# Patient Record
Sex: Female | Born: 1940
Health system: Southern US, Community
[De-identification: ages and names within clinical notes are randomized; demographics above are authoritative.]

## PROBLEM LIST (undated history)

## (undated) DIAGNOSIS — I4891 Unspecified atrial fibrillation: Secondary | ICD-10-CM

## (undated) DIAGNOSIS — I350 Nonrheumatic aortic (valve) stenosis: Secondary | ICD-10-CM

## (undated) DIAGNOSIS — I509 Heart failure, unspecified: Secondary | ICD-10-CM

## (undated) DIAGNOSIS — I10 Essential (primary) hypertension: Secondary | ICD-10-CM

## (undated) HISTORY — DX: Unspecified atrial fibrillation: I48.91

## (undated) HISTORY — DX: Heart failure, unspecified: I50.9

## (undated) HISTORY — PX: DILATION AND CURETTAGE OF UTERUS: SHX78

## (undated) HISTORY — DX: Essential (primary) hypertension: I10

## (undated) HISTORY — DX: Nonrheumatic aortic (valve) stenosis: I35.0

## (undated) HISTORY — PX: TOTAL KNEE ARTHROPLASTY: SHX125

## (undated) HISTORY — PX: JOINT REPLACEMENT: SHX530

## (undated) HISTORY — PX: BILATERAL CARPAL TUNNEL RELEASE: SHX6508

---

## 2001-06-11 ENCOUNTER — Other Ambulatory Visit: Admission: RE | Admit: 2001-06-11 | Discharge: 2001-06-11 | Payer: Self-pay | Admitting: *Deleted

## 2001-08-15 ENCOUNTER — Encounter: Payer: Self-pay | Admitting: Internal Medicine

## 2001-08-15 ENCOUNTER — Emergency Department (HOSPITAL_COMMUNITY): Admission: EM | Admit: 2001-08-15 | Discharge: 2001-08-15 | Payer: Self-pay | Admitting: Emergency Medicine

## 2001-12-12 ENCOUNTER — Ambulatory Visit (HOSPITAL_COMMUNITY): Admission: RE | Admit: 2001-12-12 | Discharge: 2001-12-12 | Payer: Self-pay | Admitting: Gastroenterology

## 2001-12-12 ENCOUNTER — Encounter (INDEPENDENT_AMBULATORY_CARE_PROVIDER_SITE_OTHER): Payer: Self-pay | Admitting: Specialist

## 2003-12-31 ENCOUNTER — Encounter: Admission: RE | Admit: 2003-12-31 | Discharge: 2003-12-31 | Payer: Self-pay | Admitting: Internal Medicine

## 2005-10-25 ENCOUNTER — Encounter: Admission: RE | Admit: 2005-10-25 | Discharge: 2005-10-25 | Payer: Self-pay

## 2008-01-11 ENCOUNTER — Encounter: Admission: RE | Admit: 2008-01-11 | Discharge: 2008-01-11 | Payer: Self-pay | Admitting: Internal Medicine

## 2008-02-04 ENCOUNTER — Inpatient Hospital Stay (HOSPITAL_COMMUNITY): Admission: RE | Admit: 2008-02-04 | Discharge: 2008-02-07 | Payer: Self-pay | Admitting: Orthopedic Surgery

## 2008-11-03 ENCOUNTER — Encounter: Admission: RE | Admit: 2008-11-03 | Discharge: 2008-11-03 | Payer: Self-pay | Admitting: Internal Medicine

## 2009-01-26 ENCOUNTER — Inpatient Hospital Stay (HOSPITAL_COMMUNITY): Admission: RE | Admit: 2009-01-26 | Discharge: 2009-01-29 | Payer: Self-pay | Admitting: Orthopedic Surgery

## 2009-09-02 ENCOUNTER — Encounter: Admission: RE | Admit: 2009-09-02 | Discharge: 2009-09-10 | Payer: Self-pay | Admitting: Orthopedic Surgery

## 2009-12-02 ENCOUNTER — Encounter: Admission: RE | Admit: 2009-12-02 | Discharge: 2010-03-02 | Payer: Self-pay | Admitting: Orthopedic Surgery

## 2011-02-02 ENCOUNTER — Ambulatory Visit (HOSPITAL_BASED_OUTPATIENT_CLINIC_OR_DEPARTMENT_OTHER)
Admission: RE | Admit: 2011-02-02 | Discharge: 2011-02-02 | Disposition: A | Discharge status: Home / Self Care | Payer: Medicare Other | Source: Ambulatory Visit | Attending: Orthopedic Surgery | Admitting: Orthopedic Surgery

## 2011-02-02 DIAGNOSIS — Z01810 Encounter for preprocedural cardiovascular examination: Secondary | ICD-10-CM | POA: Insufficient documentation

## 2011-02-02 DIAGNOSIS — M6789 Other specified disorders of synovium and tendon, multiple sites: Secondary | ICD-10-CM | POA: Insufficient documentation

## 2011-02-02 DIAGNOSIS — Z01812 Encounter for preprocedural laboratory examination: Secondary | ICD-10-CM | POA: Insufficient documentation

## 2011-02-02 DIAGNOSIS — M2390 Unspecified internal derangement of unspecified knee: Secondary | ICD-10-CM | POA: Insufficient documentation

## 2011-02-02 DIAGNOSIS — M898X9 Other specified disorders of bone, unspecified site: Secondary | ICD-10-CM | POA: Insufficient documentation

## 2011-02-02 DIAGNOSIS — Z96659 Presence of unspecified artificial knee joint: Secondary | ICD-10-CM | POA: Insufficient documentation

## 2011-02-08 NOTE — Op Note (Signed)
[name], Rodriguez              ACCOUNT NO.:  [id number]  MEDICAL RECORD NO.:  [id number]           PATIENT TYPE:  LOCATION:                                 FACILITY:  PHYSICIAN:  [name], M.D.         DATE OF BIRTH:  DATE OF PROCEDURE:  02/02/2011 DATE OF DISCHARGE:                              OPERATIVE REPORT   PREOPERATIVE DIAGNOSIS:  Right knee hypertrophic synovium and snapping of popliteal tendon.  POSTOPERATIVE DIAGNOSIS:  Right knee hypertrophic synovium and snapping of popliteal tendon.  PROCEDURE:  Right knee arthrotomy, localized synovectomy and tenolysis.  SURGEON:  [name], M.D.  ASSISTANT.:  None.  ANESTHESIA:  General.  ESTIMATED BLOOD LOSS:  Minimal.  DRAINS:  None.  COMPLICATIONS:  None.  CONDITION.:  Stable to Recovery.  BRIEF CLINICAL NOTE:  [name] is a 70 year old female, had a total knee arthroplasty done over a year ago and was doing extremely well but has a lateralized soreness and lateralized popping.  This makes the knee sometimes feel like it wants to give out on her due to the popping. Ligaments were stable.  She had excellent range of motion.  It was felt that the popliteal tendon is snapping over the lateral border of the prosthesis.  She presents now for possible tendon release and excision of any of localized hypertrophic synovitis.  PROCEDURE IN DETAIL:  After successful administration of general anesthetic, a tourniquet was placed high on her right thigh and right lower extremity was prepped and draped in the usual sterile fashion.  I placed the knee through the range of motion and felt the exact area where the popping was occurring.  It was over the lateral joint line slightly posteriorly.  A vertical incision was made over this area, total incision length about 3 inches.  I first elevated the tourniquet to 300.  I made the incision with a #10 blade through the subcutaneous tissue.  It was obvious that we had a  venous tourniquet, so let the tourniquet down and the bleeding was minimal.  I stopped the bleeding with electrocautery.  I incised down to the lateral retinaculum and made a small incision in the retinaculum.  We then entered the joint.  I felt there was popping right at the area of the popliteus tendon.  There was a spur that the tendon was tented over.  I removed that spur with the rongeur.  Also there was a fair amount of localized hypertrophic synovium which was excised.  After I did this, I placed the knee through range of motion and there was still a tiny bit of popping to free edge of the tendon, so I incised that edge of the tendon and did not fully release it.  This effectively eliminated all the popping when I put the knee through range of motion.  I then placed a clamp over the retinaculum and placed through range of motion again to see if it would snap with the retinaculum closed and it did not snap any more.  Wound was then copiously irrigated with saline solution and the capsule closed with interrupted #  1 Vicryl at retinaculum, interrupted #1 Vicryl at subcu, interrupted 2-0 Vicryl and subcuticular running 4-0 Monocryl.  I then injected about 20 cc of 0.25% Marcaine with epi into the subcutaneous tissues.  Incisions were then cleaned and dried.  I placed the knee through range of motion again.  There was no crepitus, no lateral popping.  Steri-Strips and a bulky sterile dressing were then applied and she was then awakened and transported to Recovery in stable condition.     [name], M.D.     FA/MEDQ  D:  02/02/2011  T:  02/02/2011  Job:  [id number]  Electronically Signed by [name] M.D. on 02/08/2011 07:14:52 AM

## 2011-02-17 LAB — BASIC METABOLIC PANEL
BUN: 4 mg/dL — ABNORMAL LOW (ref 6–23)
BUN: 6 mg/dL (ref 6–23)
BUN: 8 mg/dL (ref 6–23)
CO2: 27 mEq/L (ref 19–32)
CO2: 29 mEq/L (ref 19–32)
Calcium: 8.8 mg/dL (ref 8.4–10.5)
Chloride: 101 mEq/L (ref 96–112)
Chloride: 106 mEq/L (ref 96–112)
Creatinine, Ser: 0.71 mg/dL (ref 0.4–1.2)
Creatinine, Ser: 0.74 mg/dL (ref 0.4–1.2)
GFR calc Af Amer: >60 mL/min (ref >60)
GFR calc non Af Amer: >60 mL/min (ref >60)
GFR calc non Af Amer: >60 mL/min (ref >60)
Glucose, Bld: 111 mg/dL — ABNORMAL HIGH (ref 70–99)
Glucose, Bld: 133 mg/dL — ABNORMAL HIGH (ref 70–99)
Glucose, Bld: 135 mg/dL — ABNORMAL HIGH (ref 70–99)
Potassium: 4 mEq/L (ref 3.5–5.1)
Potassium: 4.6 mEq/L (ref 3.5–5.1)
Sodium: 137 mEq/L (ref 135–145)

## 2011-02-17 LAB — CBC
HCT: 31.2 % — ABNORMAL LOW (ref 36.0–46.0)
HCT: 34.9 % — ABNORMAL LOW (ref 36.0–46.0)
HCT: 41 % (ref 36.0–46.0)
Hemoglobin: 11.7 g/dL — ABNORMAL LOW (ref 12.0–15.0)
Hemoglobin: 13.9 g/dL (ref 12.0–15.0)
MCHC: 33.4 g/dL (ref 30.0–36.0)
MCHC: 33.5 g/dL (ref 30.0–36.0)
MCV: 85 fL (ref 78.0–100.0)
MCV: 86.3 fL (ref 78.0–100.0)
MCV: 86.6 fL (ref 78.0–100.0)
MCV: 87.4 fL (ref 78.0–100.0)
Platelets: 161 10*3/uL (ref 150–400)
Platelets: 164 10*3/uL (ref 150–400)
Platelets: 186 10*3/uL (ref 150–400)
RBC: 3.81 MIL/uL — ABNORMAL LOW (ref 3.87–5.11)
RDW: 13.9 % (ref 11.5–15.5)
RDW: 14 % (ref 11.5–15.5)
RDW: 14 % (ref 11.5–15.5)
RDW: 14.3 % (ref 11.5–15.5)

## 2011-02-17 LAB — URINALYSIS, ROUTINE W REFLEX MICROSCOPIC
Bilirubin Urine: NEGATIVE
Hgb urine dipstick: NEGATIVE
Nitrite: NEGATIVE
Protein, ur: NEGATIVE mg/dL
Specific Gravity, Urine: 1.014 (ref 1.005–1.030)
Urobilinogen, UA: 0.2 mg/dL (ref 0.0–1.0)

## 2011-02-17 LAB — PROTIME-INR
INR: 1 (ref 0.00–1.49)
INR: 1.6 — ABNORMAL HIGH (ref 0.00–1.49)
Prothrombin Time: 12.9 seconds (ref 11.6–15.2)
Prothrombin Time: 17.5 seconds — ABNORMAL HIGH (ref 11.6–15.2)
Prothrombin Time: 20.1 seconds — ABNORMAL HIGH (ref 11.6–15.2)

## 2011-02-17 LAB — COMPREHENSIVE METABOLIC PANEL
Albumin: 3.5 g/dL (ref 3.5–5.2)
BUN: 12 mg/dL (ref 6–23)
Chloride: 110 mEq/L (ref 96–112)
Creatinine, Ser: 0.71 mg/dL (ref 0.4–1.2)
Glucose, Bld: 113 mg/dL — ABNORMAL HIGH (ref 70–99)
Total Bilirubin: 0.8 mg/dL (ref 0.3–1.2)
Total Protein: 6.8 g/dL (ref 6.0–8.3)

## 2011-02-17 LAB — APTT: aPTT: 31 seconds (ref 24–37)

## 2011-02-17 LAB — TYPE AND SCREEN: Antibody Screen: NEGATIVE

## 2011-03-22 NOTE — Op Note (Signed)
NAMETALA, EBER              ACCOUNT NO.:  [id number]   MEDICAL RECORD NO.:  [id number]          PATIENT TYPE:  INP   LOCATION:  0002                         FACILITY:  [facility]   PHYSICIAN:  Ollen Gross, M.D.    DATE OF BIRTH:  04-Jul-1941   DATE OF PROCEDURE:  01/26/2009  DATE OF DISCHARGE:                               OPERATIVE REPORT   PREOPERATIVE DIAGNOSIS:  Osteoarthritis, right knee.   POSTOPERATIVE DIAGNOSIS:  Osteoarthritis, right knee.   PROCEDURE:  Right total knee arthroplasty.   SURGEON:  Ollen Gross, M.D.   ASSISTANT:  Alexzandrew L. Perkins, P.A.C.   ANESTHESIA:  Spinal.   ESTIMATED BLOOD LOSS:  Minimal.   DRAINS:  None.   COMPLICATIONS:  None.   TOURNIQUET TIME:  39 minutes at 300 mmHg.   CONDITION:  Stable to recovery.   CLINICAL NOTE:  [name] is a 70 year old female with end-stage  arthritis of the right knee with progressively worsening pain and  dysfunction.  She had a recent left total knee arthroplasty which has  done very well.  She presents now for right total knee arthroplasty.   PROCEDURE IN DETAIL:  After successful administration of spinal  anesthetic a tourniquet was placed high on her right thigh and her right  lower extremity was prepped and draped in the usual sterile fashion.  The extremity was wrapped in Esmarch.  The knee flexed.  The tourniquet  inflated to 300 mmHg.  A midline incision was made with a 10 blade  through subcutaneous tissue to the level of the extensor mechanism.  A  fresh blade was used to make a medial parapatellar arthrotomy.  The soft  tissue over the proximal medial tibia was subperiosteally elevated to  the joint line with the knife and into the semimembranosus bursa with a  Cobb elevator.  The soft tissue laterally was elevated with attention  being paid to avoid the patellar tendon on the tibial tubercle.  The  patella was everted and the knee flexed 90 degrees and ACL and PCL were  removed.  The  drill was used to create a starting hole in the distal  femur and the canal was thoroughly irrigated.  The 5 degrees right  valgus alignment guide was placed and referencing off the posterior  condyles rotation was marked and the block pinned to remove 11 mm of the  distal femur.  I took 11 because of preop flexion contracture.  The  distal femoral resection was made with an oscillating saw.  The sizing  block was placed and size 3 was most appropriate.  Rotation was marked  off the epicondylar axis.  The size 3 cutting block was placed and the  anterior, posterior and chamfer cuts were made.   The tibia was subluxed forward and the menisci were removed.  The  extramedullary tibial alignment guide was placed referencing proximally  at the medial aspect of the tibial tubercle and distally along the  second metatarsal axis and tibial crest.  The block was pinned to remove  about 4 mm off the more deficient medial side.  Tibial resection  was  made with an oscillating saw.  A size 3 was the most appropriate tibial  component and the proximal tibia was prepared with the modular drill and  keel punch for the size 3.  Femoral preparation was completed with the  intercondylar cut.   The size 3 mobile bearing tibial trial, size 3 posterior stabilized  femoral trial and a 10 mm posterior stabilized rotating platform insert  trial were placed.  With the 10 there was a little bit of AP laxity and  hyperextension, so I went to 12.5 which allowed for full extension with  excellent varus, valgus, anterior and posterior balance throughout full  range of motion.  The patella was everted and thickness measured to be  22 mm.  The freehand resection was taken to 12 mm, a 38 template was  placed, lug holes were drilled, trial patella was placed and it tracked  normally.  Osteophytes were removed off the posterior femur with the  trial in place.  All trials were removed and the cut bone surfaces were   prepared with pulsatile lavage.  The cement was mixed and once ready for  implantation the size 3 mobile bearing tibial tray, the size 3 posterior  stabilized femur and 38 patella were cemented into place.  The patella  was held with a clamp.  The trial 12.5 insert was placed, the knee held  in full extension and all extruded cement removed.  When the cement  fully hardened then the permanent 12.5 mm posterior stabilized rotating  platform insert was placed into the tibial tray.  The wound was  copiously irrigated with saline solution and the FloSeal injected on the  posterior capsule, the medial and lateral gutters and suprapatellar  area.  A moist sponge was placed and tourniquet released for total time  of 39 minutes.  The sponge was held for 2 minutes then removed.  Minimal  bleeding was encountered.  The bleeding that was encountered was stopped  with electrocautery.  The wound was again irrigated and the arthrotomy  closed with interrupted #1 PDS.  Flexion against gravity was 140  degrees.  The subcu was closed with interrupted 2-0 Vicryl and  subcuticular running 4-0 Monocryl.  The incision was cleaned and dried  and Steri-Strips and a bulky sterile dressing applied.  She was then  awakened and transported to recovery in stable condition.      Ollen Gross, M.D.  Electronically Signed     FA/MEDQ  D:  01/26/2009  T:  01/26/2009  Job:  [id number]

## 2011-03-22 NOTE — Discharge Summary (Signed)
[name], [name]              ACCOUNT NO.:  [id number]   MEDICAL RECORD NO.:  [id number]          PATIENT TYPE:  INP   LOCATION:  1606                         FACILITY:  [facility]   PHYSICIAN:  Ollen Gross, M.D.    DATE OF BIRTH:  [birth year]   DATE OF ADMISSION:  01/26/2009  DATE OF DISCHARGE:  01/29/2009                               DISCHARGE SUMMARY   ADMITTING DIAGNOSES:  1. Osteoarthritis right knee.  2. Hyperlipidemia.  3. Obesity.  4. Past history of urinary tract infections.  5. Urinary incontinence.  6. History of metabolic syndrome.  7. Postmenopausal.  8. Documented hypothyroidism (not on thyroid supplements).   DISCHARGE DIAGNOSES:  1. Osteoarthritis of the right knee status post right total knee      replacement arthroplasty.  2. Mild hyperkalemia (probably slight hemolysis).  3. Hyperlipidemia.  4. Obesity.  5. Past history of urinary tract infections.  6. Urinary incontinence.  7. History of metabolic syndrome.  8. Postmenopausal.  9. Documented hypothyroidism (not on thyroid supplements).   PROCEDURE:  January 26, 2009 right total knee.  Surgeon Dr. Lequita Halt,  assistant Avel Peace PA-C.  Spinal anesthesia.   CONSULTS:  None.   BRIEF HISTORY:  [name] is a 70 year old female with end-stage  arthritis of the right knee, progressive worsening pain and dysfunction,  recent left total knee now presents for right total knee.   LABORATORY DATA:  Preop CBC showed a hemoglobin of 13.9, hematocrit  41.0, white cell count 6.2, platelets 186.  Protime INR 12.9 and 1.0  with PTT of 31.  Chem panel on admission all within normal limits.  Preop UA negative.  Serial CBCs were followed.  Hemoglobin dropped down  to 11.7 and 11.1.  Last known  H and H 10.4 and 31.2.  Serial protimes  followed per Coumadin protocol.  Last known PT/INR 17.5 and 1.4.  Serial  BMETS were followed.  Potassium went up to 3.7, a high level of 5.2,  probable hemolysis, back down to a normal  level of 4.0.   EKG January 20, 2009:  Normal sinus rhythm, left anterior fascicular  block, minimal voltage criteria for LVH, no __________ performed per Dr.  Lady Deutscher.   HOSPITAL COURSE:  The patient admitted to [facility].  Taken  to the OR.  Underwent above-stated procedure without complication.  The  patient tolerated the procedure well.  Later transferred to recovery  room and orthopedic floor.  Started on PCA and p.o. analgesic pain  control following surgery.  Given 24 hours postop IV antibiotics.  I's  and O's were followed.  The patient doing pretty well on the morning of  day #1 except with some pain.  Encouraged p.o. meds for better control.  Started getting up out of bed.  Hemoglobin looked stable.  Walked only  about 4 or 5 feet on day #1.  By day #2 was walking over 120 feet.  Dressing changed on day #2.  Incision looked good.  Weaned over to p.o.  meds.  DC'd the PCA and IV fluids.  Potassium was a little high.  Felt  to be probably due to some hemolysis. Everything else looked good.  We  rechecked that, and potassium came down to a normal level of 4.  She  continued to progress well with therapy and ready to go home by postop  day #3 on January 29, 2009.   DISCHARGE PLAN:  1. The patient discharged home on January 29, 2009.  2. Discharge diagnoses, please see above.  3. Discharge medications, Coumadin, Robaxin, Percocet.   FOLLOWUP:  Two weeks.   ACTIVITY:  Weightbearing as tolerated.  Home health PT, home health  nursing.   DISPOSITION:  Home.   CONDITION UPON DISCHARGE:  Improved.      Alexzandrew L. Perkins, P.A.C.      Ollen Gross, M.D.  Electronically Signed    ALP/MEDQ  D:  01/29/2009  T:  01/29/2009  Job:  [id number]   cc:   Allena Napoleon  Fax: [phone]

## 2011-03-22 NOTE — Op Note (Signed)
[name], Rodriguez              ACCOUNT NO.:  [id number]   MEDICAL RECORD NO.:  [id number]          PATIENT TYPE:  INP   LOCATION:  0003                         FACILITY:  [facility]   PHYSICIAN:  Ollen Gross, M.D.    DATE OF BIRTH:  03/25/1941   DATE OF PROCEDURE:  02/04/2008  DATE OF DISCHARGE:                               OPERATIVE REPORT   PREOP DIAGNOSIS:  Osteoarthritis left knee.   POSTOP DIAGNOSIS:  Osteoarthritis left knee.   PROCEDURE:  Left total knee arthroplasty.   SURGEON:  Dr. Lequita Halt.   ASSISTANT:  Avel Peace, PA-C   ANESTHESIA:  Spinal with Duramorph.   ESTIMATED BLOOD LOSS:  Minimal.   DRAINS:  None.   TOURNIQUET TIME:  Thirty-seven minutes at 300 mmHg.   COMPLICATIONS:  None.   CONDITION:  Stable to recovery.   BRIEF CLINICAL NOTE:  [name] is a 70 year old female end-stage  arthritis both knees, left more symptomatic than the right.  She has  failed nonoperative management and presents for left total knee  arthroplasty and right knee cortisone injection.   PROCEDURE IN DETAIL:  After the successful administration of a spinal  anesthetic, tourniquet is placed high on the left thigh and left lower  extremity prepped and draped in the usual sterile fashion.  Extremities  were wrapped in an Esmarch, knee flexed, tourniquet inflated to 300  mmHg.  A midline incision made with a 10 blade through subcutaneous  tissue to the level of the extensor mechanism.  A fresh blade is used  make a medial parapatellar arthrotomy.  Soft tissue over the proximal  medial tibia subperiosteally elevated to the joint line with a knife  into the semimembranosus bursa with a Cobb elevator.  Soft tissue  laterally is elevated with attention being paid to avoid patellar tendon  on tibial tubercle.  Patella subluxed laterally, the knee flexed 90  degrees and the PCL was removed.  The ACL was already gone.  A drill was  used to create a starting hole in the distal femur  and the canal was  thoroughly irrigated.  The 5 degree left valgus alignment guide is  placed and referencing off the posterior condyles rotations are marked  and the block pinned to remove 11 mm of the distal femur.  I took 11  because of a preop flexion contracture.  The sizing blocks were placed.  The size 2.5 is most appropriate.  Rotations marked at the epicondylar  axis.  A size 2.5 cutting block is placed and the anterior, posterior  and chamfer cuts made.   The tibia subluxed forward and the menisci removed.  Extramedullary  tibial alignment guide is placed referencing proximally at the medial  aspect of the tibial tubercle and distally along the second metatarsal  axis and tibial crest.  The blocks pinned to remove about 10 mm off the  non deficient lateral side.  Tibial resection is made with an  oscillating saw.  It was a large medial osteophyte which is removed.  Size three is the most appropriate tibial component and the proximal  tibia prepared the  modular drill and keel punch for a size three.  Femoral preparation is completed with intercondylar cut for a size 2.5.   Size 3 mobile bearing tibial trial and size 2.5 posterior stabilized  femoral trial and 12.5 mm posterior stabilized rotating platform insert  trial placed.  Full extension is achieved with a tiny bit of varus-  valgus, anterior and posterior play.  I went to a 15 mm insertion and  had full extension with excellent varus and valgus anterior and  posterior balance throughout full range of motion.  Patella was everted  and thickness measured to be 22 mm.  Freehand resection was taken to 13  mm, a 38 template is placed, lug holes were drilled, trial patella was  placed and it tracks normally.  Osteophytes were removed off the  posterior femur with the trial in place.  All trials were removed and  the cut bone surfaces prepared with pulsatile lavage.  Cement is mixed  and once ready for implantation the size 3  mobile bearing tibial tray,  size 2.5 posterior stabilized femur and 38 patella are cemented in  place.  Patella is held with a clamp.  The trial 15 insert is placed,  the knee held in full extension and all extruded cement removed.  Once  the cement was fully hardened, the permanent 15 mm posterior stabilized  rotating platform insert is placed in the tibial tray.  Wounds are  copiously irrigated with saline solution and FloSeal then injected  posteriorly on the medial and lateral gutters and suprapatellar area.  Moist sponge is placed and tourniquet released for a total time of 37  minutes.  Sponge is held for 2 minutes and then removed.  Minimal  bleeding was encountered.  The bleeding that is encountered was stopped  with electrocautery.  I then thoroughly irrigated the wound again with  saline and closed the extensor mechanism with interrupted #1 PDS.  Flexion against gravity was about 130 degrees at which point the calf  and posterior thigh are meeting.  The subcu is then closed with  interrupted 2-0 Vicryl, the subcuticular with running 4-0 Monocryl.  Incisions cleaned and dried and Steri-Strips and a bulky sterile  dressing applied.   I then thoroughly prepped the right knee and injected it with lidocaine  and Depo-Medrol with no problems.  The patient is subsequently awakened  and transported to recovery in stable condition.      Ollen Gross, M.D.  Electronically Signed     FA/MEDQ  D:  02/04/2008  T:  02/04/2008  Job:  [id number]

## 2011-03-22 NOTE — H&P (Signed)
[name], [name]              ACCOUNT NO.:  [id number]   MEDICAL RECORD NO.:  [id number]          PATIENT TYPE:  INP   LOCATION:  NA                           FACILITY:  [facility]   PHYSICIAN:  Ollen Gross, M.D.    DATE OF BIRTH:  [birth year]   DATE OF ADMISSION:  01/26/2009  DATE OF DISCHARGE:                              HISTORY & PHYSICAL   DATE OF OFFICE VISIT HISTORY AND PHYSICAL:  January 06, 2009.   CHIEF COMPLAINT:  Right knee pain.   HISTORY OF PRESENT ILLNESS:  The patient is a 70 year old female who has  been seen by Dr. Lequita Halt for ongoing right knee pain.  She has  previously undergone a left total knee back in March 2009.  The right  knee, unfortunately, continues to become a problem.  She has known  arthritis, felt to be a candidate.  The risks and benefits have been  discussed, she would like to proceed with surgery.  At the time of this  dictation we were awaiting her clearance from Dr. Alessandra Bevels.   ALLERGIES:  No known drug allergies.   CURRENT MEDICATIONS:  Advil, Vitamin E, Fish Oil, CoQ 10, magnesium,  calcium and potassium.   PAST MEDICAL HISTORY:  1. Hyperlipidemia.  2. Obesity.  3. Past history of urinary tract infections.  4. Urinary incontinence.  5. History of metabolic syndrome.  6. Postmenopausal.  7. Documented history of hypothyroidism, however is not on any thyroid      supplements.   PAST SURGICAL HISTORY:  1. Multiple D and Cs.  2. Removal of cyst from neck age 75.  3. Bilateral knee scopes and also the left total knee.   SOCIAL HISTORY:  Married, retired, nonsmoker, very seldom intake of  alcohol.  Three children.  A sister, who is a retired Engineer, civil (consulting), will be  assisting with care after surgery.  A 1-story home with 4 steps  entering.  She does have a Living Will and Healthcare Power-Of-Attorney.   REVIEW OF SYSTEMS:  GENERAL:  No fevers, chills, night sweats.  NEURO:  No seizures, syncope or paralysis.  RESPIRATORY:  No shortness of  breath, productive cough or hemoptysis.  CARDIOVASCULAR:  No chest pain,  angina, orthopnea.  GI: No nausea, vomiting, diarrhea or constipation.  GU:  No dysuria or hematuria.  A little bit of nocturia, a little bit of  incontinence.  MUSCULOSKELETAL:  Knee pain.   VITAL SIGNS:  Pulse 74, respirations 12, blood pressure 142/72.  GENERAL:  This is a 70 year old white female, well-nourished, well-  developed, overweight, in no acute distress.  She is alert, oriented,  cooperative, pleasant, a good historian.  HEENT:  Normocephalic, atraumatic.  Pupils round, reactive.  EOMs  intact.  Does wear glasses.  NECK:  Supple.  No bruits.  CHEST:  Clear anterior to posterior chest walls.  No rhonchi, rales or  wheezing.  HEART:  Regular rate and rhythm, no murmur, S1-S10 noted.  ABDOMEN:  Soft, round, protuberant abdomen.  Bowel sounds present.  RECTAL/BREASTS/GENITALIA:  Not done, not pertinent to present illness.  EXTREMITIES:  Right knee range of  motion 5-120, marked crepitus, tender  more medial than lateral.   IMPRESSION:  Osteoarthritis, right knee.   PLAN:  The patient admitted to [facility] to undergo a right  total knee replacement arthroplasty.  Surgery will be performed by Dr.  Ollen Gross.      Alexzandrew L. Perkins, P.A.C.      Ollen Gross, M.D.  Electronically Signed    ALP/MEDQ  D:  01/25/2009  T:  01/25/2009  Job:  [id number]   cc:   Allena Napoleon  Fax: [phone]   Ollen Gross, M.D.  Fax: [phone]

## 2011-03-22 NOTE — H&P (Signed)
[name], [name]              ACCOUNT NO.:  [id number]   MEDICAL RECORD NO.:  [id number]          PATIENT TYPE:  INP   LOCATION:  1607                         FACILITY:  [facility]   PHYSICIAN:  Ollen Gross, M.D.    DATE OF BIRTH:  01/13/1941   DATE OF ADMISSION:  02/04/2008  DATE OF DISCHARGE:  02/07/2008                              HISTORY & PHYSICAL   CHIEF COMPLAINT:  Left knee pain.   HISTORY OF PRESENT ILLNESS:  The patient is a 70 year old female who has  seen by Dr. Lequita Halt for ongoing pain bilateral knees for quite some time  now.  She was referred over by Dr. Allena Napoleon for evaluation.  She  was found in the office to have end-stage arthritis in both knees.  The  left is more symptomatic than the right at this point, progressively  getting worse and felt to benefit from undergoing surgical intervention.  The risks and benefits have been discussed and she elected to proceed  with surgery.   ALLERGIES:  No known drug allergies.   CURRENT MEDICATIONS:  1. Ibuprofen.  2. Potassium.   PAST MEDICAL HISTORY:  History of UTIs, hypothyroidism, hyperlipidemia,  metabolic syndrome, postmenopausal.   PAST SURGICAL HISTORY:  Multiple D and Cs, removal of cyst from her neck  at age 34, bilateral knee scopes.   SOCIAL HISTORY:  Married, works as a Investment banker, operational, a Product manager.  Nonsmoker.  No alcohol.  Three children.   FAMILY HISTORY:  Prostate cancer.   REVIEW OF SYSTEMS:  GENERAL:  No fevers, chills or night sweats.  No  seizures, syncope or paralysis.  RESPIRATORY:  No shortness breath,  productive cough or hemoptysis.  CARDIOVASCULAR:  No chest pain, angina  or orthopnea.  GI:  No nausea, vomiting, diarrhea or constipation.  GU:  No dysuria or hematuria or discharge.  Past history of UTIs.  MUSCULOSKELETAL:  Bilateral knees.   PHYSICAL EXAMINATION:  VITAL SIGNS:  Pulse 68, respirations 12, blood  pressure 136/72.  GENERAL:  A 70 year old white female,  well-nourished, well-developed,  overweight with hip and thigh obesity.  Alert and oriented and  cooperative.  Excellent historian.  HEENT:  Normocephalic, atraumatic.  Pupils are round and reactive.  Oropharynx clear.  EOMs intact.  NECK:  Supple.  CHEST:  Clear anterior/posterior chest walls.  No rhonchi, rales or  wheezing.  HEART:  Regular rate and rhythm.  No murmur.  S1-S2 noted.  ABDOMEN:  Soft, round.  Bowel sounds present.  RECTAL:  Not done.  Not pertinent to present illness.  BREASTS:  Not done.  Not pertinent to present illness.  GENITALIA:  Not done.  Not pertinent to present illness.  EXTREMITIES:  Left knee range of motion 0-115, marked crepitus, tender  more medial than lateral.  Right knee shows range of motion 5-120,  marked crepitus, tender more medial than lateral.   IMPRESSION:  1. Osteoarthritis left knee greater than right.  2. Hypothyroidism.  3. Hyperlipidemia.  4. Obesity.  5. History of urinary tract infections.  6. Metabolic syndrome.  7. Postmenopausal.   PLAN:  The  patient admitted to [facility] and undergo a left  total knee replacement arthroplasty.  Surgery will be performed by Ollen Gross, M.D.      Alexzandrew L. Perkins, P.A.C.      Ollen Gross, M.D.  Electronically Signed    ALP/MEDQ  D:  02/07/2008  T:  02/08/2008  Job:  [id number]

## 2011-03-25 NOTE — Discharge Summary (Signed)
NAMEBIRTIE, [name]              ACCOUNT NO.:  [id number]   MEDICAL RECORD NO.:  [id number]          PATIENT TYPE:  INP   LOCATION:  1607                         FACILITY:  [facility]   PHYSICIAN:  Ollen Gross, M.D.    DATE OF BIRTH:  April 11, 1941   DATE OF ADMISSION:  02/04/2008  DATE OF DISCHARGE:  02/07/2008                               DISCHARGE SUMMARY   ADMITTING DIAGNOSIS:  1. Osteoarthritis, left knee greater than right.  2. Hypothyroidism.  3. Hyperlipidemia.  4. Obesity.  5. History of urinary tract infection.  6. Metabolic syndrome.  7. Postmenopausal.   DISCHARGE DIAGNOSIS:  1. Osteoarthritis left knee, status post left total knee arthroplasty.  2. Mild postoperative blood loss anemia, did not require transfusion.  3. Hypothyroidism.  4. Hyperlipidemia.  5. Obesity.  6. History of urinary tract infection.  7. Metabolic syndrome.  8. Postmenopausal.   PROCEDURE:  February 04, 2008, left total knee surgery by Dr. Lequita Halt,  assistant Avel Peace, PA-C, spinal anesthesia with Duramorph,  tourniquet time 37 minutes.   CONSULTS:  None.   BRIEF HISTORY:  [name] is a 70 year old female with end-stage  arthritis of both knees, left more symptomatic than right, failed  nonoperative management who now presents for a total knee arthroplasty.   LABORATORY DATA:  Rehab CBC shows a hemoglobin of 13.3, hematocrit of  38.8, white cell count 6.3, red cell count 4.56.  Postop hemoglobin 12.2  then 10.9.  Last H&H 11.6 and 32.8.  PT, PTT preop 12.9 and 30  respectively, INR 1.  Serial prothrombin time __________ PT, INR 16.7  and 1.3.  Chem panel on admission a low albumin of 3.3, remaining Chem  panel within normal limits.  Serial BMETs were followed, electrolytes  remained within normal limits, preop UA negative, blood type A positive.  EKG dated January 29, 2008, sinus rhythm with premature atrial complexes,  left axis deviation, normal tracing confirmed, no other tracing to  compare, confirmed by Dr. Donia Guiles.   HOSPITAL COURSE:  Patient admitted to [facility], tolerated  the procedure well.  __________ started on PCA and p.o. analgesics.  He  had a pretty rough night on the evening of surgery, doing a little bit  better on the morning of day 1, encouraged p.o. meds, started on  Coumadin for DVT prophylaxis.  It was also noted that she had arthritis  in the other knee on the right side.  She also received a cortisone  injection at the time of surgery.  Hemoglobin was stable postop with  excellent urinary output.  Did very well on day 1 and actually got up  and walked about 75 feet.  By day 2, she had some nausea and vomiting  early in the morning but that did resolve.  Dressing was changed on day  2, a little bit of bruising proximally, otherwise the incision looked  very good.  We discontinued her PCA and fluids once the nausea was  resolved.  Hemoglobin was 10.  Continued to progress well with physical  therapy and was ready to go home by February 07, 2008.   DISCHARGE PLAN:  1. The patient discharged home on February 07, 2008.  2. Discharge diagnosis please see above.  3. Discharge meds Coumadin, Percocet, Robaxin, Lovenox.   DIET:  Resume home diet.   ACTIVITY:  1. Weightbearing as tolerated.  2. Total knee protocol.  3. Home health PT.  4. Home health nursing.  5. Followup in 2 weeks.   DISPOSITION:  Home.   CONDITION UPON DISCHARGE:  Improved.      [name], P.A.C.      Ollen Gross, M.D.  Electronically Signed    ALP/MEDQ  D:  03/20/2008  T:  03/20/2008  Job:  [id number]   cc:   Ollen Gross, M.D.  Fax: [phone]

## 2011-03-25 NOTE — Procedures (Signed)
[facility]  Patient:    [name], [name] Visit Number: [id number] MRN: [id number]          Service Type: END Location: ENDO Attending Physician:  Louie Bun Dictated by:   Everardo All Madilyn Fireman, M.D. Proc. Date: 12/12/01 Admit Date:  12/12/2001   CC:         Brandt Loosen, M.D.                           Procedure Report  PROCEDURE:  Colonoscopy with polypectomy.  INDICATION FOR PROCEDURE:  Screening colonoscopy.  DESCRIPTION OF PROCEDURE:  The patient was placed in the left lateral decubitus position and placed on the pulse monitor with continuous low-flow oxygen delivered by nasal cannula.  She was sedated with 100 mg of IV Demerol and 10 mg of IV Versed.  The Olympus video colonoscope was inserted into the rectum and advanced to the cecum, confirmed by transillumination at McBurneys point and visualization of the ileocecal valve and appendiceal orifice.  The prep was excellent.  The cecum appeared normal.  Within the ascending colon there was a 5 mm polyp fulgurated by hot biopsy.  The remainder of the ascending colon appeared normal.  Within the transverse colon there was a 1.2 cm polyp removed by snare.  The remainder of the transverse colon appeared normal.  Within the descending and sigmoid colon there were seen several diverticula but no polyps.  In the rectum, at approximately 10 cm from the anal verge, there was a 1.5 cm pedunculated polyp removed by snare.  The colonoscope was then withdrawn, and the patient returned to the recovery room in stable condition.  She tolerated the procedure well, and there were no immediate complications.  IMPRESSION:  Ascending, transverse, and rectal polyps.  PLAN:  Await biopsy results to rule out malignancy and for determination of interval for next colonoscopy. Dictated by:   Everardo All Madilyn Fireman, M.D. Attending Physician:  Louie Bun DD:  12/12/01 TD:  12/13/01 Job: [id number]

## 2011-08-01 LAB — COMPREHENSIVE METABOLIC PANEL
AST: 24
Albumin: 3.3 — ABNORMAL LOW
Calcium: 10.3
Creatinine, Ser: 0.74
GFR calc Af Amer: >60
Sodium: 141
Total Protein: 6.8

## 2011-08-01 LAB — CBC
HCT: 35.2 — ABNORMAL LOW
Hemoglobin: 12.2
MCHC: 34.3
MCHC: 34.7
MCV: 85.1
MCV: 86
Platelets: 197
Platelets: 230
RDW: 13.4

## 2011-08-01 LAB — APTT: aPTT: 30

## 2011-08-01 LAB — URINALYSIS, ROUTINE W REFLEX MICROSCOPIC
Ketones, ur: NEGATIVE
Nitrite: NEGATIVE
Specific Gravity, Urine: 1.012
pH: 6

## 2011-08-01 LAB — TYPE AND SCREEN

## 2011-08-01 LAB — BASIC METABOLIC PANEL
BUN: 6
CO2: 27
GFR calc non Af Amer: >60
Glucose, Bld: 137 — ABNORMAL HIGH
Potassium: 4.4
Sodium: 141

## 2011-08-02 LAB — BASIC METABOLIC PANEL
BUN: 6
CO2: 30
Calcium: 8.8
Chloride: 104
Creatinine, Ser: 0.71
GFR calc Af Amer: >60
Glucose, Bld: 117 — ABNORMAL HIGH

## 2011-08-02 LAB — CBC
Hemoglobin: 11.6 — ABNORMAL LOW
MCHC: 35.1
MCHC: 35.5
MCV: 85.7
Platelets: 164
Platelets: 202
RBC: 3.62 — ABNORMAL LOW
RDW: 13.5
RDW: 13.8

## 2011-08-02 LAB — PROTIME-INR
INR: 1.1
INR: 1.3
Prothrombin Time: 14.8

## 2015-12-03 DIAGNOSIS — I700 Atherosclerosis of aorta: Secondary | ICD-10-CM | POA: Diagnosis not present

## 2015-12-03 DIAGNOSIS — Z0000 Encounter for general adult medical examination without abnormal findings: Secondary | ICD-10-CM | POA: Diagnosis not present

## 2015-12-03 DIAGNOSIS — R7303 Prediabetes: Secondary | ICD-10-CM | POA: Diagnosis not present

## 2015-12-03 DIAGNOSIS — K760 Fatty (change of) liver, not elsewhere classified: Secondary | ICD-10-CM | POA: Diagnosis not present

## 2015-12-03 DIAGNOSIS — R7309 Other abnormal glucose: Secondary | ICD-10-CM | POA: Diagnosis not present

## 2015-12-03 DIAGNOSIS — E559 Vitamin D deficiency, unspecified: Secondary | ICD-10-CM | POA: Diagnosis not present

## 2015-12-03 DIAGNOSIS — Z6841 Body Mass Index (BMI) 40.0 and over, adult: Secondary | ICD-10-CM | POA: Diagnosis not present

## 2016-01-13 DIAGNOSIS — D225 Melanocytic nevi of trunk: Secondary | ICD-10-CM | POA: Diagnosis not present

## 2016-01-13 DIAGNOSIS — Z1283 Encounter for screening for malignant neoplasm of skin: Secondary | ICD-10-CM | POA: Diagnosis not present

## 2016-01-13 DIAGNOSIS — L820 Inflamed seborrheic keratosis: Secondary | ICD-10-CM | POA: Diagnosis not present

## 2016-01-13 DIAGNOSIS — L918 Other hypertrophic disorders of the skin: Secondary | ICD-10-CM | POA: Diagnosis not present

## 2016-05-25 DIAGNOSIS — N950 Postmenopausal bleeding: Secondary | ICD-10-CM | POA: Diagnosis not present

## 2016-05-25 DIAGNOSIS — N760 Acute vaginitis: Secondary | ICD-10-CM | POA: Diagnosis not present

## 2016-05-25 DIAGNOSIS — B9689 Other specified bacterial agents as the cause of diseases classified elsewhere: Secondary | ICD-10-CM | POA: Diagnosis not present

## 2016-06-22 DIAGNOSIS — Z6841 Body Mass Index (BMI) 40.0 and over, adult: Secondary | ICD-10-CM

## 2016-06-22 DIAGNOSIS — N950 Postmenopausal bleeding: Secondary | ICD-10-CM | POA: Diagnosis not present

## 2016-06-22 DIAGNOSIS — N840 Polyp of corpus uteri: Secondary | ICD-10-CM | POA: Diagnosis not present

## 2016-07-19 DIAGNOSIS — L298 Other pruritus: Secondary | ICD-10-CM | POA: Diagnosis not present

## 2016-07-19 DIAGNOSIS — N898 Other specified noninflammatory disorders of vagina: Secondary | ICD-10-CM | POA: Insufficient documentation

## 2016-07-19 DIAGNOSIS — N950 Postmenopausal bleeding: Secondary | ICD-10-CM | POA: Diagnosis not present

## 2016-07-19 DIAGNOSIS — N840 Polyp of corpus uteri: Secondary | ICD-10-CM | POA: Insufficient documentation

## 2016-07-19 DIAGNOSIS — Z6841 Body Mass Index (BMI) 40.0 and over, adult: Secondary | ICD-10-CM | POA: Diagnosis not present

## 2016-07-19 DIAGNOSIS — Z01818 Encounter for other preprocedural examination: Secondary | ICD-10-CM | POA: Diagnosis not present

## 2016-07-27 DIAGNOSIS — N950 Postmenopausal bleeding: Secondary | ICD-10-CM | POA: Diagnosis not present

## 2016-07-27 DIAGNOSIS — Z79899 Other long term (current) drug therapy: Secondary | ICD-10-CM | POA: Diagnosis not present

## 2016-07-27 DIAGNOSIS — N840 Polyp of corpus uteri: Secondary | ICD-10-CM | POA: Diagnosis not present

## 2016-07-27 DIAGNOSIS — R0602 Shortness of breath: Secondary | ICD-10-CM | POA: Diagnosis not present

## 2016-08-08 DIAGNOSIS — H532 Diplopia: Secondary | ICD-10-CM | POA: Diagnosis not present

## 2016-08-25 DIAGNOSIS — N950 Postmenopausal bleeding: Secondary | ICD-10-CM | POA: Diagnosis not present

## 2016-08-25 DIAGNOSIS — N840 Polyp of corpus uteri: Secondary | ICD-10-CM | POA: Diagnosis not present

## 2016-12-08 DIAGNOSIS — Z0000 Encounter for general adult medical examination without abnormal findings: Secondary | ICD-10-CM | POA: Diagnosis not present

## 2016-12-08 DIAGNOSIS — R7303 Prediabetes: Secondary | ICD-10-CM | POA: Diagnosis not present

## 2016-12-08 DIAGNOSIS — Z1159 Encounter for screening for other viral diseases: Secondary | ICD-10-CM | POA: Diagnosis not present

## 2016-12-08 DIAGNOSIS — R202 Paresthesia of skin: Secondary | ICD-10-CM | POA: Diagnosis not present

## 2016-12-08 DIAGNOSIS — F329 Major depressive disorder, single episode, unspecified: Secondary | ICD-10-CM | POA: Diagnosis not present

## 2016-12-08 DIAGNOSIS — E559 Vitamin D deficiency, unspecified: Secondary | ICD-10-CM | POA: Diagnosis not present

## 2016-12-08 DIAGNOSIS — I890 Lymphedema, not elsewhere classified: Secondary | ICD-10-CM | POA: Diagnosis not present

## 2016-12-08 DIAGNOSIS — I700 Atherosclerosis of aorta: Secondary | ICD-10-CM | POA: Diagnosis not present

## 2017-06-05 DIAGNOSIS — L820 Inflamed seborrheic keratosis: Secondary | ICD-10-CM | POA: Diagnosis not present

## 2017-06-05 DIAGNOSIS — Z1283 Encounter for screening for malignant neoplasm of skin: Secondary | ICD-10-CM | POA: Diagnosis not present

## 2017-06-05 DIAGNOSIS — L918 Other hypertrophic disorders of the skin: Secondary | ICD-10-CM | POA: Diagnosis not present

## 2017-07-11 DIAGNOSIS — D122 Benign neoplasm of ascending colon: Secondary | ICD-10-CM | POA: Diagnosis not present

## 2017-07-11 DIAGNOSIS — K5730 Diverticulosis of large intestine without perforation or abscess without bleeding: Secondary | ICD-10-CM | POA: Diagnosis not present

## 2017-07-11 DIAGNOSIS — Z86010 Personal history of colonic polyps: Secondary | ICD-10-CM | POA: Diagnosis not present

## 2017-07-11 DIAGNOSIS — D123 Benign neoplasm of transverse colon: Secondary | ICD-10-CM | POA: Diagnosis not present

## 2017-07-11 DIAGNOSIS — K648 Other hemorrhoids: Secondary | ICD-10-CM | POA: Diagnosis not present

## 2017-07-11 DIAGNOSIS — K635 Polyp of colon: Secondary | ICD-10-CM | POA: Diagnosis not present

## 2017-09-01 ENCOUNTER — Encounter: Payer: Self-pay | Admitting: Podiatry

## 2017-09-01 ENCOUNTER — Ambulatory Visit (INDEPENDENT_AMBULATORY_CARE_PROVIDER_SITE_OTHER): Payer: PPO | Admitting: Podiatry

## 2017-09-01 DIAGNOSIS — B351 Tinea unguium: Secondary | ICD-10-CM

## 2017-09-01 NOTE — Progress Notes (Signed)
   Subjective:    Patient ID: [name], female    DOB: 05/30/1941, 76 y.o.   MRN: [id number]  HPI    Review of Systems  All other systems reviewed and are negative.      Objective:   Physical Exam        Assessment & Plan:

## 2017-09-06 NOTE — Progress Notes (Signed)
Subjective:    Patient ID: [name], female   DOB: 76 y.o.   MRN: [id number]   HPI patient states I've had a damaged left big toenail and all my toenails give me trouble and I'm having problems with discomfort and inability to cut. States it's been going on for a while and patient does not smoke and likes to be active    Review of Systems  All other systems reviewed and are negative.       Objective:  Physical Exam  Constitutional: She appears well-developed and well-nourished.  Cardiovascular: Intact distal pulses.   Pulmonary/Chest: Effort normal.  Musculoskeletal: Normal range of motion.  Neurological: She is alert.  Skin: Skin is warm.  Nursing note and vitals reviewed.  neurovascular status intact muscle strength adequate range of motion was within normal limits with patient found to have a cracked left hallux nail with the distal two thirds irritating especially on the medial side and all nailbeds thickened and incurvated in the corners with moderate pain when pressed. Patient's noted to have good digital perfusion and is well oriented 3     Assessment:   Mycotic nail infection with damaged left hallux nail with pain of nails 1 through 5 both feet      Plan:    H&P condition reviewed and debridement nailbeds 1-5 both feet accomplished with no iatrogenic bleeding noted. Patient be seen back to recheck

## 2017-12-04 ENCOUNTER — Ambulatory Visit (INDEPENDENT_AMBULATORY_CARE_PROVIDER_SITE_OTHER): Payer: PPO | Admitting: Podiatry

## 2017-12-04 DIAGNOSIS — M79675 Pain in left toe(s): Secondary | ICD-10-CM | POA: Diagnosis not present

## 2017-12-04 DIAGNOSIS — M79674 Pain in right toe(s): Secondary | ICD-10-CM | POA: Diagnosis not present

## 2017-12-04 DIAGNOSIS — B351 Tinea unguium: Secondary | ICD-10-CM

## 2017-12-06 NOTE — Progress Notes (Signed)
Subjective:   Patient ID: [name], female   DOB: 77 y.o.   MRN: [id number]   HPI Patient presents with painful nailbeds 1-5 both feet that she cannot take care of   ROS      Objective:  Physical Exam  Neurovascular status intact muscle strength is adequate with thick yellow brittle nailbeds 1-5 both feet that are painful     Assessment:  Mycotic nail infection 1-5 both feet with pain     Plan:  Debride painful nailbeds 1-5 both feet with no iatrogenic bleeding noted

## 2018-01-12 DIAGNOSIS — Z131 Encounter for screening for diabetes mellitus: Secondary | ICD-10-CM | POA: Diagnosis not present

## 2018-01-12 DIAGNOSIS — I700 Atherosclerosis of aorta: Secondary | ICD-10-CM | POA: Diagnosis not present

## 2018-01-12 DIAGNOSIS — Z136 Encounter for screening for cardiovascular disorders: Secondary | ICD-10-CM | POA: Diagnosis not present

## 2018-01-12 DIAGNOSIS — Z0000 Encounter for general adult medical examination without abnormal findings: Secondary | ICD-10-CM | POA: Diagnosis not present

## 2018-01-12 DIAGNOSIS — K760 Fatty (change of) liver, not elsewhere classified: Secondary | ICD-10-CM | POA: Diagnosis not present

## 2018-01-12 DIAGNOSIS — Z1239 Encounter for other screening for malignant neoplasm of breast: Secondary | ICD-10-CM | POA: Diagnosis not present

## 2018-01-19 ENCOUNTER — Other Ambulatory Visit (HOSPITAL_BASED_OUTPATIENT_CLINIC_OR_DEPARTMENT_OTHER): Payer: Self-pay | Admitting: Family Medicine

## 2018-01-19 DIAGNOSIS — Z1231 Encounter for screening mammogram for malignant neoplasm of breast: Secondary | ICD-10-CM

## 2018-03-05 ENCOUNTER — Ambulatory Visit (INDEPENDENT_AMBULATORY_CARE_PROVIDER_SITE_OTHER): Payer: PPO | Admitting: Podiatry

## 2018-03-05 ENCOUNTER — Encounter: Payer: Self-pay | Admitting: Podiatry

## 2018-03-05 DIAGNOSIS — M79674 Pain in right toe(s): Secondary | ICD-10-CM

## 2018-03-05 DIAGNOSIS — M79675 Pain in left toe(s): Secondary | ICD-10-CM

## 2018-03-05 DIAGNOSIS — B351 Tinea unguium: Secondary | ICD-10-CM | POA: Diagnosis not present

## 2018-03-06 NOTE — Progress Notes (Signed)
Subjective:   Patient ID: [name], female   DOB: 77 y.o.   MRN: [id number]   HPI Patient presents with elongated nails   ROS      Objective:  Physical Exam  Patient has thick yellow brittle nailbeds 1-5 both feet that are painful     Assessment:  Mycotic nail infection with pain 1-5 both feet    Plan:  Debride painful nailbeds 1-5 both feet with no iatrogenic bleeding noted

## 2018-03-09 DIAGNOSIS — M1712 Unilateral primary osteoarthritis, left knee: Secondary | ICD-10-CM | POA: Diagnosis not present

## 2018-03-09 DIAGNOSIS — M1990 Unspecified osteoarthritis, unspecified site: Secondary | ICD-10-CM | POA: Insufficient documentation

## 2018-03-09 DIAGNOSIS — M1711 Unilateral primary osteoarthritis, right knee: Secondary | ICD-10-CM | POA: Diagnosis not present

## 2018-03-09 DIAGNOSIS — M170 Bilateral primary osteoarthritis of knee: Secondary | ICD-10-CM | POA: Diagnosis not present

## 2018-03-09 DIAGNOSIS — Z471 Aftercare following joint replacement surgery: Secondary | ICD-10-CM | POA: Diagnosis not present

## 2018-03-09 DIAGNOSIS — Z96653 Presence of artificial knee joint, bilateral: Secondary | ICD-10-CM | POA: Diagnosis not present

## 2018-04-20 DIAGNOSIS — M25562 Pain in left knee: Secondary | ICD-10-CM | POA: Diagnosis not present

## 2018-04-20 DIAGNOSIS — Z96659 Presence of unspecified artificial knee joint: Secondary | ICD-10-CM | POA: Insufficient documentation

## 2018-04-27 DIAGNOSIS — Z96652 Presence of left artificial knee joint: Secondary | ICD-10-CM | POA: Diagnosis not present

## 2018-04-30 DIAGNOSIS — Z96652 Presence of left artificial knee joint: Secondary | ICD-10-CM | POA: Diagnosis not present

## 2018-05-02 DIAGNOSIS — Z96652 Presence of left artificial knee joint: Secondary | ICD-10-CM | POA: Diagnosis not present

## 2018-05-07 DIAGNOSIS — Z96652 Presence of left artificial knee joint: Secondary | ICD-10-CM | POA: Diagnosis not present

## 2018-05-21 DIAGNOSIS — Z96652 Presence of left artificial knee joint: Secondary | ICD-10-CM | POA: Diagnosis not present

## 2018-05-23 ENCOUNTER — Ambulatory Visit: Payer: PPO | Admitting: Podiatry

## 2018-05-23 ENCOUNTER — Encounter: Payer: Self-pay | Admitting: Podiatry

## 2018-05-23 DIAGNOSIS — M79674 Pain in right toe(s): Secondary | ICD-10-CM

## 2018-05-23 DIAGNOSIS — M79675 Pain in left toe(s): Secondary | ICD-10-CM | POA: Diagnosis not present

## 2018-05-23 DIAGNOSIS — B351 Tinea unguium: Secondary | ICD-10-CM

## 2018-05-23 NOTE — Progress Notes (Signed)
Subjective:   Patient ID: [name], female   DOB: 77 y.o.   MRN: [id number]   HPI Patient presents with thickened nailbeds 1-5 both feet that are difficult for the patient to wear shoe gear with   ROS      Objective:  Physical Exam  Mycotic nail infection with pain 1-5 both feet with thick yellow brittle nailbeds     Assessment:  Chronic painful nailbeds that are thick and yellow indicating mycotic infection     Plan:  Debridement of painful nailbeds 1-5 both feet with no iatrogenic bleeding noted

## 2018-05-24 DIAGNOSIS — Z96652 Presence of left artificial knee joint: Secondary | ICD-10-CM | POA: Diagnosis not present

## 2018-05-29 DIAGNOSIS — Z96652 Presence of left artificial knee joint: Secondary | ICD-10-CM | POA: Diagnosis not present

## 2018-05-31 DIAGNOSIS — Z96652 Presence of left artificial knee joint: Secondary | ICD-10-CM | POA: Diagnosis not present

## 2018-06-05 DIAGNOSIS — Z96652 Presence of left artificial knee joint: Secondary | ICD-10-CM | POA: Diagnosis not present

## 2018-10-29 DIAGNOSIS — R42 Dizziness and giddiness: Secondary | ICD-10-CM | POA: Diagnosis not present

## 2018-11-28 DIAGNOSIS — L728 Other follicular cysts of the skin and subcutaneous tissue: Secondary | ICD-10-CM | POA: Diagnosis not present

## 2018-11-28 DIAGNOSIS — D1801 Hemangioma of skin and subcutaneous tissue: Secondary | ICD-10-CM | POA: Diagnosis not present

## 2018-11-28 DIAGNOSIS — L918 Other hypertrophic disorders of the skin: Secondary | ICD-10-CM | POA: Diagnosis not present

## 2018-11-28 DIAGNOSIS — L820 Inflamed seborrheic keratosis: Secondary | ICD-10-CM | POA: Diagnosis not present

## 2018-11-28 DIAGNOSIS — L739 Follicular disorder, unspecified: Secondary | ICD-10-CM | POA: Diagnosis not present

## 2019-01-02 ENCOUNTER — Other Ambulatory Visit: Payer: Self-pay

## 2019-01-02 ENCOUNTER — Encounter (HOSPITAL_BASED_OUTPATIENT_CLINIC_OR_DEPARTMENT_OTHER): Payer: Self-pay

## 2019-01-02 ENCOUNTER — Emergency Department (HOSPITAL_BASED_OUTPATIENT_CLINIC_OR_DEPARTMENT_OTHER)
Admission: EM | Admit: 2019-01-02 | Discharge: 2019-01-03 | Disposition: A | Discharge status: Home / Self Care | Payer: PPO | Attending: Emergency Medicine | Admitting: Emergency Medicine

## 2019-01-02 DIAGNOSIS — R030 Elevated blood-pressure reading, without diagnosis of hypertension: Secondary | ICD-10-CM | POA: Diagnosis not present

## 2019-01-02 DIAGNOSIS — R51 Headache: Secondary | ICD-10-CM | POA: Diagnosis present

## 2019-01-02 DIAGNOSIS — G4489 Other headache syndrome: Secondary | ICD-10-CM | POA: Diagnosis not present

## 2019-01-02 DIAGNOSIS — R1031 Right lower quadrant pain: Secondary | ICD-10-CM | POA: Diagnosis not present

## 2019-01-02 MED ORDER — METOCLOPRAMIDE HCL 5 MG/ML IJ SOLN
10.0000 mg | Freq: Once | INTRAMUSCULAR | Status: AC
  Administered 2019-01-02: 10 mg via INTRAVENOUS
  Filled 2019-01-02: qty 2

## 2019-01-02 MED ORDER — DIPHENHYDRAMINE HCL 50 MG/ML IJ SOLN
25.0000 mg | Freq: Once | INTRAMUSCULAR | Status: AC
  Administered 2019-01-02: 25 mg via INTRAVENOUS
  Filled 2019-01-02: qty 1

## 2019-01-02 NOTE — ED Triage Notes (Addendum)
C/o HA started ~4pm-feels may be r/t to solumedrol injection that she received today at PCP for groin/muscle pain-pt took 2 excedrin ~9pm with some relief-was also concerned her BP was elevated on home BP cuff- NAD-steady gait

## 2019-01-02 NOTE — ED Notes (Signed)
ED Provider at bedside. 

## 2019-01-02 NOTE — ED Provider Notes (Signed)
[place] EMERGENCY DEPARTMENT Provider Note   CSN: [id number] Arrival date & time: 01/02/19  2201    History   Chief Complaint Chief Complaint  Patient presents with  . Headache    HPI [name] is a 78 y.o. female.     The history is provided by the patient.  Headache  Pain location:  Generalized Onset quality:  Gradual Timing:  Constant Progression:  Improving Chronicity:  New Relieved by: Excedrin. Worsened by:  Nothing Associated symptoms: no blurred vision, no eye pain, no fever, no numbness, no visual change, no vomiting and no weakness    Patient reports onset of headache several hours ago.  It has been gradual in onset. He was seen earlier in the day by her PCP and was given a Solu-Medrol injection for soft tissue injury.  Soon after she noted onset of headache and her blood pressure was elevated.  She reports her blood pressure usually runs in the 90s However at home she been having blood pressures in the high 100s to low 200s She has no other acute symptoms   Patient Active Problem List   Diagnosis Date Noted  . Endometrial polyp 07/19/2016  . Vaginal itching 07/19/2016  . Morbid obesity with BMI of 40.0-44.9, adult ([place]) 06/22/2016  . Postmenopausal bleeding 05/25/2016    History reviewed. No pertinent surgical history.   OB History   No obstetric history on file.      Home Medications    Prior to Admission medications   Not on File    Family History No family history on file.  Social History Social History   Tobacco Use  . Smoking status: Never Smoker  . Smokeless tobacco: Never Used  Substance Use Topics  . Alcohol use: Yes    Comment: rare  . Drug use: Never     Allergies   Gluten meal   Review of Systems Review of Systems  Constitutional: Negative for fever.  Eyes: Negative for blurred vision, pain and visual disturbance.  Respiratory: Negative for shortness of breath.   Cardiovascular: Negative for  chest pain.  Gastrointestinal: Negative for vomiting.  Neurological: Positive for headaches. Negative for weakness and numbness.  All other systems reviewed and are negative.    Physical Exam Updated Vital Signs BP (!) 223/113 (BP Location: Right Arm)   Pulse 88   Temp 97.9 F (36.6 C) (Oral)   Resp 20   Ht 1.676 m (5\' 6" )   Wt 116.1 kg   SpO2 100%   BMI 41.32 kg/m   Physical Exam CONSTITUTIONAL: Well developed/well nourished HEAD: Normocephalic/atraumatic EYES: EOMI/PERRL, no nystagmus, no ptosis ENMT: Mucous membranes moist NECK: supple no meningeal signs, no bruits SPINE/BACK:entire spine nontender CV: S1/S2 noted, soft murmur noted LUNGS: Lungs are clear to auscultation bilaterally, no apparent distress ABDOMEN: soft, nontender, no rebound or guarding GU:no cva tenderness NEURO:Awake/alert, face symmetric, no arm or leg drift is noted Equal 5/5 strength with shoulder abduction, elbow flex/extension, wrist flex/extension in upper extremities and equal hand grips bilaterally Equal 5/5 strength with hip flexion,knee flex/extension, foot dorsi/plantar flexion Cranial nerves 3/4/5/6/05/15/09/11/12 tested and intact Gait normal without ataxia No past pointing Sensation to light touch intact in all extremities EXTREMITIES: pulses normal, full ROM SKIN: warm, color normal PSYCH: no abnormalities of mood noted, alert and oriented to situation    ED Treatments / Results  Labs (all labs ordered are listed, but only abnormal results are displayed) Labs Reviewed - No data to display  EKG None  Radiology No results found.  Procedures Procedures (including critical care time)  Medications Ordered in ED Medications  metoCLOPramide (REGLAN) injection 10 mg (10 mg Intravenous Given 01/02/19 2333)  diphenhydrAMINE (BENADRYL) injection 25 mg (25 mg Intravenous Given 01/02/19 2331)     Initial Impression / Assessment and Plan / ED Course  I have reviewed the triage vital  signs and the nursing notes.         Patient presents for headache and elevated blood pressure after receiving Solu-Medrol injection.  Strong suspicion that is the culprit. Suspect this caused elevation in her blood pressure which is triggering headache.  No acute neurologic deficits. 12:29 AM Headache is improved and patient feels better.  BP is mildly improved.  She is requesting discharge. Final Clinical Impressions(s) / ED Diagnoses   Final diagnoses:  Other headache syndrome    ED Discharge Orders    None       Ripley Fraise, MD 01/03/19 0030

## 2019-01-03 NOTE — Discharge Instructions (Addendum)

## 2019-01-14 DIAGNOSIS — M6281 Muscle weakness (generalized): Secondary | ICD-10-CM | POA: Diagnosis not present

## 2019-01-14 DIAGNOSIS — M25551 Pain in right hip: Secondary | ICD-10-CM | POA: Diagnosis not present

## 2019-01-16 DIAGNOSIS — M6281 Muscle weakness (generalized): Secondary | ICD-10-CM | POA: Diagnosis not present

## 2019-01-16 DIAGNOSIS — M25551 Pain in right hip: Secondary | ICD-10-CM | POA: Diagnosis not present

## 2019-01-21 DIAGNOSIS — M6281 Muscle weakness (generalized): Secondary | ICD-10-CM | POA: Diagnosis not present

## 2019-01-21 DIAGNOSIS — M25551 Pain in right hip: Secondary | ICD-10-CM | POA: Diagnosis not present

## 2019-01-23 DIAGNOSIS — M25551 Pain in right hip: Secondary | ICD-10-CM | POA: Diagnosis not present

## 2019-01-23 DIAGNOSIS — M6281 Muscle weakness (generalized): Secondary | ICD-10-CM | POA: Diagnosis not present

## 2019-01-28 DIAGNOSIS — M6281 Muscle weakness (generalized): Secondary | ICD-10-CM | POA: Diagnosis not present

## 2019-01-28 DIAGNOSIS — M25551 Pain in right hip: Secondary | ICD-10-CM | POA: Diagnosis not present

## 2019-01-30 DIAGNOSIS — M6281 Muscle weakness (generalized): Secondary | ICD-10-CM | POA: Diagnosis not present

## 2019-01-30 DIAGNOSIS — M25551 Pain in right hip: Secondary | ICD-10-CM | POA: Diagnosis not present

## 2019-01-31 DIAGNOSIS — K760 Fatty (change of) liver, not elsewhere classified: Secondary | ICD-10-CM | POA: Diagnosis not present

## 2019-01-31 DIAGNOSIS — Z0000 Encounter for general adult medical examination without abnormal findings: Secondary | ICD-10-CM | POA: Diagnosis not present

## 2019-07-10 DIAGNOSIS — Z86010 Personal history of colonic polyps: Secondary | ICD-10-CM | POA: Diagnosis not present

## 2019-07-10 DIAGNOSIS — L0591 Pilonidal cyst without abscess: Secondary | ICD-10-CM | POA: Diagnosis not present

## 2019-07-11 DIAGNOSIS — K611 Rectal abscess: Secondary | ICD-10-CM | POA: Diagnosis not present

## 2019-07-17 DIAGNOSIS — Z1283 Encounter for screening for malignant neoplasm of skin: Secondary | ICD-10-CM | POA: Diagnosis not present

## 2019-07-17 DIAGNOSIS — D225 Melanocytic nevi of trunk: Secondary | ICD-10-CM | POA: Diagnosis not present

## 2019-07-17 DIAGNOSIS — L570 Actinic keratosis: Secondary | ICD-10-CM | POA: Diagnosis not present

## 2019-07-17 DIAGNOSIS — X32XXXA Exposure to sunlight, initial encounter: Secondary | ICD-10-CM | POA: Diagnosis not present

## 2019-12-28 DIAGNOSIS — Z1239 Encounter for other screening for malignant neoplasm of breast: Secondary | ICD-10-CM | POA: Diagnosis not present

## 2019-12-28 DIAGNOSIS — Z1231 Encounter for screening mammogram for malignant neoplasm of breast: Secondary | ICD-10-CM | POA: Diagnosis not present

## 2019-12-30 DIAGNOSIS — N3942 Incontinence without sensory awareness: Secondary | ICD-10-CM | POA: Diagnosis not present

## 2019-12-30 DIAGNOSIS — N3941 Urge incontinence: Secondary | ICD-10-CM | POA: Diagnosis not present

## 2020-01-22 DIAGNOSIS — L570 Actinic keratosis: Secondary | ICD-10-CM | POA: Diagnosis not present

## 2020-01-22 DIAGNOSIS — X32XXXD Exposure to sunlight, subsequent encounter: Secondary | ICD-10-CM | POA: Diagnosis not present

## 2020-01-22 DIAGNOSIS — L820 Inflamed seborrheic keratosis: Secondary | ICD-10-CM | POA: Diagnosis not present

## 2020-02-27 DIAGNOSIS — Z0000 Encounter for general adult medical examination without abnormal findings: Secondary | ICD-10-CM | POA: Diagnosis not present

## 2020-02-27 DIAGNOSIS — Z131 Encounter for screening for diabetes mellitus: Secondary | ICD-10-CM | POA: Diagnosis not present

## 2020-02-27 DIAGNOSIS — I700 Atherosclerosis of aorta: Secondary | ICD-10-CM | POA: Diagnosis not present

## 2020-02-27 DIAGNOSIS — Z136 Encounter for screening for cardiovascular disorders: Secondary | ICD-10-CM | POA: Diagnosis not present

## 2020-05-15 DIAGNOSIS — Z20822 Contact with and (suspected) exposure to covid-19: Secondary | ICD-10-CM | POA: Diagnosis not present

## 2020-05-15 DIAGNOSIS — Z03818 Encounter for observation for suspected exposure to other biological agents ruled out: Secondary | ICD-10-CM | POA: Diagnosis not present

## 2020-06-08 DIAGNOSIS — Z6841 Body Mass Index (BMI) 40.0 and over, adult: Secondary | ICD-10-CM | POA: Diagnosis not present

## 2020-06-08 DIAGNOSIS — N907 Vulvar cyst: Secondary | ICD-10-CM | POA: Insufficient documentation

## 2020-06-08 DIAGNOSIS — N952 Postmenopausal atrophic vaginitis: Secondary | ICD-10-CM | POA: Diagnosis not present

## 2020-06-15 DIAGNOSIS — D0421 Carcinoma in situ of skin of right ear and external auricular canal: Secondary | ICD-10-CM | POA: Diagnosis not present

## 2020-07-07 DIAGNOSIS — M7021 Olecranon bursitis, right elbow: Secondary | ICD-10-CM | POA: Diagnosis not present

## 2020-07-21 DIAGNOSIS — Z08 Encounter for follow-up examination after completed treatment for malignant neoplasm: Secondary | ICD-10-CM | POA: Diagnosis not present

## 2020-07-21 DIAGNOSIS — Z85828 Personal history of other malignant neoplasm of skin: Secondary | ICD-10-CM | POA: Diagnosis not present

## 2020-07-30 DIAGNOSIS — R002 Palpitations: Secondary | ICD-10-CM | POA: Diagnosis not present

## 2020-10-14 DIAGNOSIS — L728 Other follicular cysts of the skin and subcutaneous tissue: Secondary | ICD-10-CM | POA: Diagnosis not present

## 2020-10-14 DIAGNOSIS — L821 Other seborrheic keratosis: Secondary | ICD-10-CM | POA: Diagnosis not present

## 2020-10-14 DIAGNOSIS — B353 Tinea pedis: Secondary | ICD-10-CM | POA: Diagnosis not present

## 2020-11-18 DIAGNOSIS — K641 Second degree hemorrhoids: Secondary | ICD-10-CM | POA: Diagnosis not present

## 2020-11-18 DIAGNOSIS — Z86010 Personal history of colonic polyps: Secondary | ICD-10-CM | POA: Diagnosis not present

## 2020-11-18 DIAGNOSIS — Z1211 Encounter for screening for malignant neoplasm of colon: Secondary | ICD-10-CM | POA: Diagnosis not present

## 2020-11-18 DIAGNOSIS — K640 First degree hemorrhoids: Secondary | ICD-10-CM | POA: Diagnosis not present

## 2020-11-18 DIAGNOSIS — K5730 Diverticulosis of large intestine without perforation or abscess without bleeding: Secondary | ICD-10-CM | POA: Diagnosis not present

## 2021-01-14 DIAGNOSIS — K1370 Unspecified lesions of oral mucosa: Secondary | ICD-10-CM | POA: Diagnosis not present

## 2021-02-01 DIAGNOSIS — J018 Other acute sinusitis: Secondary | ICD-10-CM | POA: Diagnosis not present

## 2021-03-16 DIAGNOSIS — Z0000 Encounter for general adult medical examination without abnormal findings: Secondary | ICD-10-CM | POA: Diagnosis not present

## 2021-03-16 DIAGNOSIS — I700 Atherosclerosis of aorta: Secondary | ICD-10-CM | POA: Diagnosis not present

## 2021-05-12 DIAGNOSIS — I452 Bifascicular block: Secondary | ICD-10-CM | POA: Diagnosis not present

## 2021-05-12 DIAGNOSIS — R000 Tachycardia, unspecified: Secondary | ICD-10-CM | POA: Diagnosis not present

## 2021-05-12 DIAGNOSIS — I10 Essential (primary) hypertension: Secondary | ICD-10-CM | POA: Diagnosis not present

## 2021-05-12 DIAGNOSIS — R0602 Shortness of breath: Secondary | ICD-10-CM | POA: Diagnosis not present

## 2021-05-12 DIAGNOSIS — Z79899 Other long term (current) drug therapy: Secondary | ICD-10-CM | POA: Diagnosis not present

## 2021-05-19 DIAGNOSIS — R051 Acute cough: Secondary | ICD-10-CM | POA: Diagnosis not present

## 2021-05-19 DIAGNOSIS — R000 Tachycardia, unspecified: Secondary | ICD-10-CM | POA: Diagnosis not present

## 2021-05-19 DIAGNOSIS — I10 Essential (primary) hypertension: Secondary | ICD-10-CM | POA: Diagnosis not present

## 2021-07-01 DIAGNOSIS — H4913 Fourth [trochlear] nerve palsy, bilateral: Secondary | ICD-10-CM | POA: Diagnosis not present

## 2021-07-01 DIAGNOSIS — I10 Essential (primary) hypertension: Secondary | ICD-10-CM | POA: Diagnosis not present

## 2021-07-01 DIAGNOSIS — H532 Diplopia: Secondary | ICD-10-CM | POA: Diagnosis not present

## 2021-07-26 DIAGNOSIS — Z961 Presence of intraocular lens: Secondary | ICD-10-CM | POA: Diagnosis not present

## 2021-07-26 DIAGNOSIS — H524 Presbyopia: Secondary | ICD-10-CM | POA: Diagnosis not present

## 2021-07-26 DIAGNOSIS — H52203 Unspecified astigmatism, bilateral: Secondary | ICD-10-CM | POA: Diagnosis not present

## 2021-07-26 DIAGNOSIS — H532 Diplopia: Secondary | ICD-10-CM | POA: Diagnosis not present

## 2021-07-28 DIAGNOSIS — I10 Essential (primary) hypertension: Secondary | ICD-10-CM | POA: Diagnosis not present

## 2021-08-10 NOTE — Progress Notes (Signed)
Referring-Brent Breedlove, PAC Reason for referral-hypertension and tachycardia  HPI: 80 year old female for evaluation of hypertension and tachycardia at request of Ammie Dalton, PA-C.  This has been noted recently by primary care.  By records she has refused all medications.  Patient apparently seen in [place] July 2022 with dyspnea on exertion and lower extremity edema.  By report her blood pressure was 192/93.  Laboratories including CBC, CMP, troponin and BNP normal though I do not have those records available.  Laboratories August 2022 showed sodium 138, potassium 4, BUN 20 and creatinine 0.75.  Liver functions normal.  Patient typically does not have dyspnea on exertion, orthopnea, PND, exertional chest pain or syncope.  She does have chronic pedal edema.  In July when she was in [place] she was walking into a restaurant and developed severe shortness of breath.  She stopped for 10 to 15 minutes and this improved.  However it has persisted since that time.  She continues to deny orthopnea or PND but her pedal edema has worsened bilaterally.  She also has gained 10 pounds over the past 2 months.  There is no chest pain, fevers, chills or productive cough.  Cardiology now asked to evaluate.  Current Outpatient Medications  Medication Sig Dispense Refill   hydrochlorothiazide (HYDRODIURIL) 25 MG tablet Take 25 mg by mouth every morning.     metoprolol tartrate (LOPRESSOR) 50 MG tablet Take 50 mg by mouth 2 (two) times daily.     No current facility-administered medications for this visit.    Allergies  Allergen Reactions   Gluten Meal Other (See Comments)    Pt. Stated,"gives me sinuses."   Methylprednisolone Sodium Succ Other (See Comments)     Past Medical History:  Diagnosis Date   Hypertension     Past Surgical History:  Procedure Laterality Date   DILATION AND CURETTAGE OF UTERUS     TOTAL KNEE ARTHROPLASTY Bilateral     Social History   Socioeconomic History    Marital status: Widowed    Spouse name: Not on file   Number of children: 3   Years of education: Not on file   Highest education level: Not on file  Occupational History   Not on file  Tobacco Use   Smoking status: Never   Smokeless tobacco: Never  Vaping Use   Vaping Use: Never used  Substance and Sexual Activity   Alcohol use: Yes    Comment: rare   Drug use: Never   Sexual activity: Not on file  Other Topics Concern   Not on file  Social History Narrative   Not on file   Social Determinants of Health   Financial Resource Strain: Not on file  Food Insecurity: Not on file  Transportation Needs: Not on file  Physical Activity: Not on file  Stress: Not on file  Social Connections: Not on file  Intimate Partner Violence: Not on file    Family History  Problem Relation Age of Onset   Angina Mother     ROS: Intermittent double vision but no fevers or chills, productive cough, hemoptysis, dysphasia, odynophagia, melena, hematochezia, dysuria, hematuria, rash, seizure activity, orthopnea, PND, claudication. Remaining systems are negative.  Physical Exam:   Blood pressure (!) 148/82, height 5\' 6"  (1.676 m), weight 267 lb 3.2 oz (121.2 kg), SpO2 95 %.  General:  Well developed/obese in NAD Skin warm/dry Patient not depressed No peripheral clubbing Back-normal HEENT-normal/normal eyelids Neck supple/normal carotid upstroke bilaterally; no bruits; no JVD; no thyromegaly  chest - CTA/ normal expansion CV - RRR/normal S1 and S2; no rubs or gallops;  PMI nondisplaced, 2/6 to 3/6 systolic murmur left sternal border radiating to the carotids.  S2 is not diminished. Abdomen -NT/ND, no HSM, no mass, + bowel sounds, no bruit 2+ femoral pulses, no bruits Ext-1+ edema, no chords, 2+ DP Neuro-grossly nonfocal  ECG -May 12, 2021-sinus rhythm, left anterior fascicular block, left ventricular hypertrophy.  Personally reviewed  Today's electrocardiogram shows normal sinus rhythm  at a rate of 64, left anterior fascicular block, left ventricular hypertrophy.  A/P  1 acute diastolic congestive heart failure-she appears to be volume overloaded on examination.  She has 1+ lower extremity edema and has gained 10 pounds in 2 months.  I will discontinue hydrochlorothiazide and instead treat with Lasix 40 mg daily.  In 1 week we will check potassium, renal function and BNP.  We will arrange an echocardiogram to assess LV function.  2 murmur-she sounds to have aortic stenosis on examination.  I will arrange an echocardiogram to further assess.  3 hypertension-this is a new diagnosis since July by her report.  Her blood pressure is borderline today but she states it is running better at home.  I am discontinuing her hydrochlorothiazide and instead treating with Lasix.  She will follow her blood pressure and we will adjust regimen as needed.  4 tachycardia-patient's heart rate appears to be normal today.  We will continue metoprolol.  Echocardiogram as outlined above.  I will also check a TSH.  Kirk Ruths, MD

## 2021-08-18 ENCOUNTER — Ambulatory Visit: Payer: PPO | Admitting: Cardiology

## 2021-08-18 ENCOUNTER — Other Ambulatory Visit: Payer: Self-pay

## 2021-08-18 ENCOUNTER — Encounter: Payer: Self-pay | Admitting: Cardiology

## 2021-08-18 VITALS — BP 148/82 | Ht 66.0 in | Wt 267.2 lb

## 2021-08-18 DIAGNOSIS — I5032 Chronic diastolic (congestive) heart failure: Secondary | ICD-10-CM | POA: Diagnosis not present

## 2021-08-18 DIAGNOSIS — I10 Essential (primary) hypertension: Secondary | ICD-10-CM | POA: Diagnosis not present

## 2021-08-18 MED ORDER — FUROSEMIDE 40 MG PO TABS: 40.0000 mg | ORAL_TABLET | Freq: Every day | ORAL | 3 refills | Status: DC

## 2021-08-18 NOTE — Patient Instructions (Signed)
Medication Instructions:  STOP TAKING HYDROCHLOROTHIAZIDE START TAKING FUROSEMIDE 40 MG ONCE A DAY. *If you need a refill on your cardiac medications before your next appointment, please call your pharmacy*   Lab Work: [place] If you have labs (blood work) drawn today and your tests are completely normal, you will receive your results only by: [place] (if you have MyChart) OR A paper copy in the mail If you have any lab test that is abnormal or we need to change your treatment, we will call you to review the results.   Testing/Procedures: Your physician has requested that you have an echocardiogram. Echocardiography is a painless test that uses sound waves to create images of your heart. It provides your doctor with information about the size and shape of your heart and how well your heart's chambers and valves are working. This procedure takes approximately one hour. There are no restrictions for this procedure. DONE AT [place]: At [facility], you and your health needs are our priority.  As part of our continuing mission to provide you with exceptional heart care, we have created designated Provider Care Teams.  These Care Teams include your primary Cardiologist (physician) and Advanced Practice Providers (APPs -  Physician Assistants and Nurse Practitioners) who all work together to provide you with the care you need, when you need it.  We recommend signing up for the patient portal called "MyChart".  Sign up information is provided on this After Visit Summary.  MyChart is used to connect with patients for Virtual Visits (Telemedicine).  Patients are able to view lab/test results, encounter notes, upcoming appointments, etc.  Non-urgent messages can be sent to your provider as well.   To learn more about what you can do with MyChart, go to [email or web address].    Your next appointment:   6-8 week(s)  The format for your next appointment:    In Person  Provider:   Kirk Ruths, MD

## 2021-08-20 DIAGNOSIS — H532 Diplopia: Secondary | ICD-10-CM | POA: Diagnosis not present

## 2021-08-20 DIAGNOSIS — H547 Unspecified visual loss: Secondary | ICD-10-CM | POA: Diagnosis not present

## 2021-08-25 DIAGNOSIS — I5032 Chronic diastolic (congestive) heart failure: Secondary | ICD-10-CM | POA: Diagnosis not present

## 2021-08-25 DIAGNOSIS — I10 Essential (primary) hypertension: Secondary | ICD-10-CM | POA: Diagnosis not present

## 2021-08-26 ENCOUNTER — Encounter: Payer: Self-pay | Admitting: *Deleted

## 2021-08-26 LAB — BASIC METABOLIC PANEL
BUN/Creatinine Ratio: 17 (ref 12–28)
BUN: 13 mg/dL (ref 8–27)
CO2: 20 mmol/L (ref 20–29)
Calcium: 10.1 mg/dL (ref 8.7–10.3)
Chloride: 102 mmol/L (ref 96–106)
Creatinine, Ser: 0.75 mg/dL (ref 0.57–1.00)
Glucose: 89 mg/dL (ref 70–99)
Potassium: 4.4 mmol/L (ref 3.5–5.2)
Sodium: 141 mmol/L (ref 134–144)
eGFR: 80 mL/min/{1.73_m2} (ref >59)

## 2021-08-26 LAB — TSH: TSH: 3.43 u[IU]/mL (ref 0.450–4.500)

## 2021-08-26 LAB — PRO B NATRIURETIC PEPTIDE: NT-Pro BNP: 639 pg/mL (ref 0–738)

## 2021-08-26 LAB — D-DIMER, QUANTITATIVE: D-DIMER: 0.65 mg/L FEU — ABNORMAL HIGH (ref 0.00–0.49)

## 2021-09-02 ENCOUNTER — Ambulatory Visit (HOSPITAL_COMMUNITY): Payer: PPO | Attending: Internal Medicine

## 2021-09-02 ENCOUNTER — Other Ambulatory Visit: Payer: Self-pay

## 2021-09-02 DIAGNOSIS — I5032 Chronic diastolic (congestive) heart failure: Secondary | ICD-10-CM | POA: Diagnosis not present

## 2021-09-02 DIAGNOSIS — I10 Essential (primary) hypertension: Secondary | ICD-10-CM | POA: Diagnosis not present

## 2021-09-02 LAB — ECHOCARDIOGRAM COMPLETE
AR max vel: 2.05 cm2
AV Area VTI: 2 cm2
AV Area mean vel: 1.95 cm2
AV Mean grad: 11.8 mmHg
AV Peak grad: 20.7 mmHg
Ao pk vel: 2.28 m/s
Area-P 1/2: 2.37 cm2
S' Lateral: 2.8 cm

## 2021-09-02 MED ORDER — PERFLUTREN LIPID MICROSPHERE
1.0000 mL | INTRAVENOUS | Status: AC | PRN
Start: 2021-09-02 — End: 2021-09-02
  Administered 2021-09-02: 1 mL via INTRAVENOUS

## 2021-09-03 ENCOUNTER — Telehealth: Payer: Self-pay | Admitting: *Deleted

## 2021-09-03 NOTE — Telephone Encounter (Signed)
Spoke with pt, [name] of dr Jacalyn Lefevre recommendations. Sodium and fluid restrictions discussed.

## 2021-09-03 NOTE — Telephone Encounter (Signed)
Spoke with pt regarding echo results, she reports there has been no change in the swelling in her legs, her weight is the same and she continues to have SOB with exertion since she started furosemide 40 mg daily. She also reports her bp is running 140-150. Patient instructed to take extra furosemide for the next 3 days to see if that makes a difference. She is to call the office back on Monday if there has been no change. Will forward for dr Stanford Breed review

## 2021-09-10 DIAGNOSIS — I5032 Chronic diastolic (congestive) heart failure: Secondary | ICD-10-CM | POA: Diagnosis not present

## 2021-09-10 DIAGNOSIS — I10 Essential (primary) hypertension: Secondary | ICD-10-CM | POA: Diagnosis not present

## 2021-09-23 DIAGNOSIS — I5032 Chronic diastolic (congestive) heart failure: Secondary | ICD-10-CM | POA: Diagnosis not present

## 2021-09-23 DIAGNOSIS — I10 Essential (primary) hypertension: Secondary | ICD-10-CM | POA: Diagnosis not present

## 2021-09-27 NOTE — Progress Notes (Signed)
HPI: FU CHF and tachycardia. By records she has refused all medications.  Patient apparently seen in [place] July 2022 with dyspnea on exertion and lower extremity edema.  By report her blood pressure was 192/93.  Laboratories including CBC, CMP, troponin and BNP normal though I do not have those records available.  Laboratories August 2022 showed sodium 138, potassium 4, BUN 20 and creatinine 0.75.  Liver functions normal.  Echocardiogram October 2022 showed vigorous LV function, mild left ventricular hypertrophy, grade 1 diastolic dysfunction, mild left atrial enlargement, mild aortic stenosis with mean gradient 12 mmHg and mildly dilated ascending aorta.  At last office visit we began Lasix.  Since last seen, she denies dyspnea, chest pain, palpitations or syncope.  She continues to have occasional pedal edema.  Her Lasix was changed to [facility] by her primary care physician.  She is taking 40 mg every morning with an additional 20 mg in the evening every other day.  Current Outpatient Medications  Medication Sig Dispense Refill   metoprolol tartrate (LOPRESSOR) 50 MG tablet Take 50 mg by mouth 2 (two) times daily.     potassium gluconate 595 (99 K) MG TABS tablet TAKE 1 TABLET BY MOUTH EVERY DAY FOR 30 DAYS     torsemide (DEMADEX) 20 MG tablet Take 20 mg by mouth daily.     No current facility-administered medications for this visit.     Past Medical History:  Diagnosis Date   Hypertension     Past Surgical History:  Procedure Laterality Date   DILATION AND CURETTAGE OF UTERUS     TOTAL KNEE ARTHROPLASTY Bilateral     Social History   Socioeconomic History   Marital status: Widowed    Spouse name: Not on file   Number of children: 3   Years of education: Not on file   Highest education level: Not on file  Occupational History   Not on file  Tobacco Use   Smoking status: Never   Smokeless tobacco: Never  Vaping Use   Vaping Use: Never used  Substance and Sexual  Activity   Alcohol use: Yes    Comment: rare   Drug use: Never   Sexual activity: Not on file  Other Topics Concern   Not on file  Social History Narrative   Not on file   Social Determinants of Health   Financial Resource Strain: Not on file  Food Insecurity: Not on file  Transportation Needs: Not on file  Physical Activity: Not on file  Stress: Not on file  Social Connections: Not on file  Intimate Partner Violence: Not on file    Family History  Problem Relation Age of Onset   Angina Mother     ROS: no fevers or chills, productive cough, hemoptysis, dysphasia, odynophagia, melena, hematochezia, dysuria, hematuria, rash, seizure activity, orthopnea, PND, pedal edema, claudication. Remaining systems are negative.  Physical Exam: Well-developed obese in no acute distress.  Skin is warm and dry.  HEENT is normal.  Neck is supple.  Chest is clear to auscultation with normal expansion.  Cardiovascular exam is regular rate and rhythm.  Abdominal exam nontender or distended. No masses palpated. Extremities show 1+ edema. neuro grossly intact  A/P  1 chronic diastolic congestive heart failure-volume status has improved since last evaluation but she continues to have 1+ edema.  We will continue Demadex at present dose for now.  Check potassium, renal function and BNP.  If renal function stable will potentially increase to  60 mg of Demadex daily.  We discussed fluid restriction and low-sodium diet.  Note echocardiogram shows preserved LV function.  2 mild aortic stenosis-she will need follow-up echoes in the future.  3 hypertension-blood pressure is borderline today but she states controlled at home.  We will follow and adjust medications as needed.  4 history of tachycardia-heart rate is normal today.  Continue beta-blocker.  Previous TSH normal.  Kirk Ruths, MD

## 2021-10-08 ENCOUNTER — Encounter: Payer: Self-pay | Admitting: Cardiology

## 2021-10-08 ENCOUNTER — Ambulatory Visit: Payer: PPO | Admitting: Cardiology

## 2021-10-08 ENCOUNTER — Other Ambulatory Visit: Payer: Self-pay

## 2021-10-08 VITALS — BP 138/76 | HR 88 | Ht 66.0 in | Wt 269.0 lb

## 2021-10-08 DIAGNOSIS — I10 Essential (primary) hypertension: Secondary | ICD-10-CM

## 2021-10-08 DIAGNOSIS — I350 Nonrheumatic aortic (valve) stenosis: Secondary | ICD-10-CM | POA: Diagnosis not present

## 2021-10-08 DIAGNOSIS — I5032 Chronic diastolic (congestive) heart failure: Secondary | ICD-10-CM | POA: Diagnosis not present

## 2021-10-08 NOTE — Patient Instructions (Signed)
  Follow-Up: At [facility], you and your health needs are our priority.  As part of our continuing mission to provide you with exceptional heart care, we have created designated Provider Care Teams.  These Care Teams include your primary Cardiologist (physician) and Advanced Practice Providers (APPs -  Physician Assistants and Nurse Practitioners) who all work together to provide you with the care you need, when you need it.  We recommend signing up for the patient portal called "MyChart".  Sign up information is provided on this After Visit Summary.  MyChart is used to connect with patients for Virtual Visits (Telemedicine).  Patients are able to view lab/test results, encounter notes, upcoming appointments, etc.  Non-urgent messages can be sent to your provider as well.   To learn more about what you can do with MyChart, go to [email or web address].    Your next appointment:   3 month(s)  The format for your next appointment:   In Person  Provider:   Coletta Memos, FNP or Sande Rives, PA-C    Then, Kirk Ruths MD will plan to see you again in 6 month(s).

## 2021-10-09 LAB — BASIC METABOLIC PANEL
BUN/Creatinine Ratio: 20 (ref 12–28)
BUN: 18 mg/dL (ref 8–27)
CO2: 26 mmol/L (ref 20–29)
Calcium: 9.7 mg/dL (ref 8.7–10.3)
Chloride: 102 mmol/L (ref 96–106)
Creatinine, Ser: 0.91 mg/dL (ref 0.57–1.00)
Glucose: 98 mg/dL (ref 70–99)
Potassium: 3.9 mmol/L (ref 3.5–5.2)
Sodium: 141 mmol/L (ref 134–144)
eGFR: 64 mL/min/{1.73_m2} (ref >59)

## 2021-10-09 LAB — PRO B NATRIURETIC PEPTIDE: NT-Pro BNP: 323 pg/mL (ref 0–738)

## 2021-10-11 ENCOUNTER — Encounter: Payer: Self-pay | Admitting: *Deleted

## 2021-10-15 DIAGNOSIS — I5032 Chronic diastolic (congestive) heart failure: Secondary | ICD-10-CM | POA: Diagnosis not present

## 2021-10-15 DIAGNOSIS — I10 Essential (primary) hypertension: Secondary | ICD-10-CM | POA: Diagnosis not present

## 2021-11-05 DIAGNOSIS — I5032 Chronic diastolic (congestive) heart failure: Secondary | ICD-10-CM | POA: Diagnosis not present

## 2021-12-21 DIAGNOSIS — I10 Essential (primary) hypertension: Secondary | ICD-10-CM | POA: Diagnosis not present

## 2022-01-01 NOTE — Progress Notes (Signed)
Cardiology Office Note:    Date:  01/14/2022   ID:  [name], DOB Dec 23, 1940, MRN [id number]  PCP:  Orpah Melter, MD  Cardiologist:  Kirk Ruths, MD  Electrophysiologist:  None   Referring MD: Orpah Melter, MD   Chief Complaint: follow-up of CHF  History of Present Illness:    [name] is a 81 y.o. female with a history of chronic diastolic CHF, tachycardia, mild aortic stenosis, and hypertension who is followed by Dr. Stanford Breed and presents today for follow-up of CHF.  She was referred to Dr. Stanford Breed in 08/2021 for further evaluation of hypertension and tachycardia.  She had been seen in [place] in 05/2021 with dyspnea on exertion and lower extremity edema.  BP was reportedly markedly elevated at 192/93 at that time.  Reported persistent dyspnea on exertion since that time and also reported 10 pound weight gain and worsening lower extremity edema over the past 2 months.  HCTZ was stopped and she was started on Lasix for acute diastolic CHF.  Pro-BNP was normal.  Echo was ordered and showed LVEF of greater than 75% with normal wall motion, mild LVH, grade 1 diastolic dysfunction, mild AS, and mildly dilated ascending aorta measuring 39 mm.  She was last seen by Dr. Stanford Breed in 10/2021 at which time she continued report occasional pedal edema denies any dyspnea.  Her Lasix had been changed to Torsemide by her PCP.  Patient presents today for follow-up.  Here alone.  Patient reports continued significant shortness of breath with exertion since last visit. She states that she cannot walk 150 feet before getting short of breath and having to sit down to catch her breath. Interestingly, she works out in the pool multiple times a week and is able to walk 2000 feet with no problems.  No shortness of breath at rest.  No orthopnea or PND.  She continues to have chronic bilateral lower extremity swelling but states this is stable.  No chest pain, palpitations, lightheadedness,  dizziness, syncope.  She is very concerned about her dyspnea.  She states before 05/2021 she was having no problems with dyspnea or edema.  She is wondering if this could be pulmonary in nature and it very well may be. She has no smoking history. She has not had any type of ischemic evaluation yet. Of note, she did have a D-dimer that was negative when adjusted for age back in 08/2021.  Patient recently had labs checked at her PCPs office: Na 143,  4.0, BUN 22, Cr 1.01.  Past Medical History:  Diagnosis Date   Hypertension     Past Surgical History:  Procedure Laterality Date   DILATION AND CURETTAGE OF UTERUS     TOTAL KNEE ARTHROPLASTY Bilateral     Current Medications: Current Meds  Medication Sig   metoprolol tartrate (LOPRESSOR) 50 MG tablet Take 50 mg by mouth 2 (two) times daily.   potassium gluconate 595 (99 K) MG TABS tablet TAKE 1 TABLET BY MOUTH EVERY DAY FOR 30 DAYS   torsemide (DEMADEX) 20 MG tablet Take 20 mg by mouth daily. Take 1 tablet daily and Take 2 tablets in the morning and 1 tablet in the evening every other day     Allergies:   Gluten meal and Methylprednisolone sodium succ   Social History   Socioeconomic History   Marital status: Widowed    Spouse name: Not on file   Number of children: 3   Years of education: Not on file  Highest education level: Not on file  Occupational History   Not on file  Tobacco Use   Smoking status: Never   Smokeless tobacco: Never  Vaping Use   Vaping Use: Never used  Substance and Sexual Activity   Alcohol use: Yes    Comment: rare   Drug use: Never   Sexual activity: Not on file  Other Topics Concern   Not on file  Social History Narrative   Not on file   Social Determinants of Health   Financial Resource Strain: Not on file  Food Insecurity: Not on file  Transportation Needs: Not on file  Physical Activity: Not on file  Stress: Not on file  Social Connections: Not on file     Family History: The  patient's family history includes Angina in her mother.  ROS:   Please see the history of present illness.     EKGs/Labs/Other Studies Reviewed:    The following studies were reviewed today:  Echocardiogram 09/02/2021: Impressions: 1. Left ventricular ejection fraction, by estimation, is >75%. The left  ventricle has hyperdynamic function. The left ventricle has no regional  wall motion abnormalities. There is mild left ventricular hypertrophy.  Left ventricular diastolic parameters  are consistent with Grade I diastolic dysfunction (impaired relaxation).  Elevated left atrial pressure.   2. Right ventricular systolic function is normal. The right ventricular  size is normal.   3. Left atrial size was mildly dilated.   4. Trivial mitral valve regurgitation. Moderate mitral annular  calcification.   5. AV is thickened, calcified with restricted motion Peak and mean  gradients through the valve are 22 and 12 mm Hg respectively.  Dimensionless index is 0.44. COnsistent with mild AS> . The aortic valve  is tricuspid. Aortic valve regurgitation is not  visualized.   6. Aortic dilatation noted. There is mild dilatation of the ascending  aorta, measuring 39 mm.   7. The inferior vena cava is normal in size with greater than 50%  respiratory variability, suggesting right atrial pressure of 3 mmHg.  EKG:  EKG not ordered today.  Recent Labs: 08/25/2021: TSH 3.430 10/08/2021: BUN 18; Creatinine, Ser 0.91; NT-Pro BNP 323; Potassium 3.9; Sodium 141  Recent Lipid Panel No results found for: CHOL, TRIG, HDL, CHOLHDL, VLDL, LDLCALC, LDLDIRECT  Physical Exam:    Vital Signs: BP 132/68    Pulse 64    Ht 5\' 6"  (1.676 m)    Wt 264 lb 3.2 oz (119.8 kg)    SpO2 100%    BMI 42.64 kg/m     Wt Readings from Last 3 Encounters:  01/14/22 264 lb 3.2 oz (119.8 kg)  10/08/21 269 lb (122 kg)  08/18/21 267 lb 3.2 oz (121.2 kg)     General: 81 y.o. obese Caucasian female in no acute  distress. HEENT: Normocephalic and atraumatic. Sclera clear.  Neck: Supple. No JVD. Heart: RRR. II/VI systolic murmur. No gallops or rubs. Radial pulses 2+ and equal bilaterally. Lungs: No increased work of breathing. Clear to ausculation bilaterally. No wheezes, rhonchi, or rales.  Abdomen: Soft, non-distended, and non-tender to palpation. Extremities: 1+ pitting edema bilaterally.   Skin: Warm and dry. Neuro: Alert and oriented x3. No focal deficits. Psych: Normal affect. Responds appropriately.  Assessment:    1. Dyspnea on exertion   2. Chronic diastolic CHF (congestive heart failure) ([place])   3. Mild aortic stenosis   4. Primary hypertension   5. History of tachycardia     Plan:  Dyspnea on Exertion Patient notes significant dyspnea on exertion since 05/2021 (no dyspnea prior to this). Work-up unremarkable so far. D-dimer negative. BNP normal. Echo unremarkable as below.  - Degree of dyspnea is out of proportion to mild diastolic dysfunction so do not think this is CHF. - Will order Lexiscan Myoview to rule out dyspnea as an anginal equivalent. Discussed getting a coronary CTA but given she is 80s suspect she may have a lot of coronary calcium that would obstruct images so decided on Lexiscan instead. - Will also go ahead and refer to Pulmonology.  Shared Decision Making/Informed Consent{ The risks [chest pain, shortness of breath, cardiac arrhythmias, dizziness, blood pressure fluctuations, myocardial infarction, stroke/transient ischemic attack, nausea, vomiting, allergic reaction, radiation exposure, metallic taste sensation and life-threatening complications (estimated to be 1 in 10,000)], benefits (risk stratification, diagnosing coronary artery disease, treatment guidance) and alternatives of a nuclear stress test were discussed in detail with Ms. Jallow and she agrees to proceed.  Chronic Diastolic CHF Echo in 84/6962 showed LVEF of greater than 75% with normal wall  motion, mild LVH, grade 1 diastolic dysfunction, mild AS, and mildly dilated ascending aorta measuring 39 mm. - Mild lower extremity edema bilaterally but otherwise appear euvolemic on exam.  - Currently on Torsemide 40mg  in the morning with an additional 20mg  in the evening every other day. Continue.  Mild Aortic Stenosis Echo in 08/2021 showed a thickened/calcified aortic valve with restricted motion.  Peak and mean gradients 22 mmHg and 12 mmHg respectively consistent with mild AS. - Will continue routine monitoring as an outpatient.  Recommend repeat Echo in 3 to 5 years or sooner if patient has worsening symptoms.  Hypertension BP well controlled. - Continue Lopressor 50mg  twice daily.  History of Tachycardia Patient has a history of tachycardia. TSH normal in the past.  - Heart rate normal today in the 60s today. - Continue Lopressor 50mg  twice daily.  Disposition: Follow up in 3 months.   Medication Adjustments/Labs and Tests Ordered: Current medicines are reviewed at length with the patient today.  Concerns regarding medicines are outlined above.  Orders Placed This Encounter  Procedures   Ambulatory referral to Pulmonology   MYOCARDIAL PERFUSION IMAGING   No orders of the defined types were placed in this encounter.   Patient Instructions  Medication Instructions:  Your physician recommends that you continue on your current medications as directed. Please refer to the Current Medication list given to you today.  *If you need a refill on your cardiac medications before your next appointment, please call your pharmacy*   Lab Work: None orderd  If you have labs (blood work) drawn today and your tests are completely normal, you will receive your results only by: [place] (if you have MyChart) OR A paper copy in the mail If you have any lab test that is abnormal or we need to change your treatment, we will call you to review the  results.   Testing/Procedures: Your physician has requested that you have a lexiscan myoview. For further information please visit [email or web address]. Please follow instruction sheet, BELOW:    You are scheduled for a Myocardial Perfusion Imaging Study  Please arrive 15 minutes prior to your appointment time for registration and insurance purposes.  The test will take approximately 3 to 4 hours to complete; you may bring reading material.  If someone comes with you to your appointment, they will need to remain in the main lobby due to limited space in the testing area. **  If you are pregnant or breastfeeding, please notify the nuclear lab prior to your appointment**  How to prepare for your Myocardial Perfusion Test: Do not eat or drink 3 hours prior to your test, except you may have water. Do not consume products containing caffeine (regular or decaffeinated) 12 hours prior to your test. (ex: coffee, chocolate, sodas, tea). Do bring a list of your current medications with you.  If not listed below, you may take your medications as normal. Do not take metoprolol (Lopressor, Toprol) THE MORNING OF the test.  Bring the medication to your appointment as you may be required to take it once the test is complete. Do wear comfortable clothes (no dresses or overalls) and walking shoes, tennis shoes preferred (No heels or open toe shoes are allowed). Do NOT wear cologne, perfume, aftershave, or lotions (deodorant is allowed). If these instructions are not followed, your test will have to be rescheduled.      Follow-Up: At [facility], you and your health needs are our priority.  As part of our continuing mission to provide you with exceptional heart care, we have created designated Provider Care Teams.  These Care Teams include your primary Cardiologist (physician) and Advanced Practice Providers (APPs -  Physician Assistants and Nurse Practitioners) who all work together to provide you with the  care you need, when you need it.  We recommend signing up for the patient portal called "MyChart".  Sign up information is provided on this After Visit Summary.  MyChart is used to connect with patients for Virtual Visits (Telemedicine).  Patients are able to view lab/test results, encounter notes, upcoming appointments, etc.  Non-urgent messages can be sent to your provider as well.   To learn more about what you can do with MyChart, go to [email or web address].    Your next appointment:   3 month(s)  The format for your next appointment:   In Person  Provider:    Kirk Ruths, MD, or Sande Rives, PA-C  Other Instructions    Signed, Darreld Mclean, PA-C  01/14/2022 5:15 PM    [place]

## 2022-01-14 ENCOUNTER — Other Ambulatory Visit: Payer: Self-pay

## 2022-01-14 ENCOUNTER — Encounter: Payer: Self-pay | Admitting: Student

## 2022-01-14 ENCOUNTER — Ambulatory Visit (INDEPENDENT_AMBULATORY_CARE_PROVIDER_SITE_OTHER): Payer: PPO | Admitting: Student

## 2022-01-14 VITALS — BP 132/68 | HR 64 | Ht 66.0 in | Wt 264.2 lb

## 2022-01-14 DIAGNOSIS — I350 Nonrheumatic aortic (valve) stenosis: Secondary | ICD-10-CM

## 2022-01-14 DIAGNOSIS — I5032 Chronic diastolic (congestive) heart failure: Secondary | ICD-10-CM | POA: Diagnosis not present

## 2022-01-14 DIAGNOSIS — I10 Essential (primary) hypertension: Secondary | ICD-10-CM | POA: Diagnosis not present

## 2022-01-14 DIAGNOSIS — R0609 Other forms of dyspnea: Secondary | ICD-10-CM

## 2022-01-14 DIAGNOSIS — Z87898 Personal history of other specified conditions: Secondary | ICD-10-CM

## 2022-01-14 NOTE — Patient Instructions (Addendum)
Medication Instructions:  ?Your physician recommends that you continue on your current medications as directed. Please refer to the Current Medication list given to you today. ? ?*If you need a refill on your cardiac medications before your next appointment, please call your pharmacy* ? ? ?Lab Work: ?None orderd ? ?If you have labs (blood work) drawn today and your tests are completely normal, you will receive your results only by: ?MyChart Message (if you have MyChart) OR ?A paper copy in the mail ?If you have any lab test that is abnormal or we need to change your treatment, we will call you to review the results. ? ? ?Testing/Procedures: ?Your physician has requested that you have a lexiscan myoview. For further information please visit [email or web address]. Please follow instruction sheet, BELOW: ? ? ? ?You are scheduled for a Myocardial Perfusion Imaging Study  ?Please arrive 15 minutes prior to your appointment time for registration and insurance purposes. ? ?The test will take approximately 3 to 4 hours to complete; you may bring reading material.  If someone comes with you to your appointment, they will need to remain in the main lobby due to limited space in the testing area. **If you are pregnant or breastfeeding, please notify the nuclear lab prior to your appointment** ? ?How to prepare for your Myocardial Perfusion Test: ?Do not eat or drink 3 hours prior to your test, except you may have water. ?Do not consume products containing caffeine (regular or decaffeinated) 12 hours prior to your test. (ex: coffee, chocolate, sodas, tea). ?Do bring a list of your current medications with you.  If not listed below, you may take your medications as normal. ?Do not take metoprolol (Lopressor, Toprol) THE MORNING OF the test.  Bring the medication to your appointment as you may be required to take it once the test is complete. ?Do wear comfortable clothes (no dresses or overalls) and walking shoes, tennis shoes  preferred (No heels or open toe shoes are allowed). ?Do NOT wear cologne, perfume, aftershave, or lotions (deodorant is allowed). ?If these instructions are not followed, your test will have to be rescheduled. ? ? ? ? ? ?Follow-Up: ?At [facility], you and your health needs are our priority.  As part of our continuing mission to provide you with exceptional heart care, we have created designated Provider Care Teams.  These Care Teams include your primary Cardiologist (physician) and Advanced Practice Providers (APPs -  Physician Assistants and Nurse Practitioners) who all work together to provide you with the care you need, when you need it. ? ?We recommend signing up for the patient portal called "MyChart".  Sign up information is provided on this After Visit Summary.  MyChart is used to connect with patients for Virtual Visits (Telemedicine).  Patients are able to view lab/test results, encounter notes, upcoming appointments, etc.  Non-urgent messages can be sent to your provider as well.   ?To learn more about what you can do with MyChart, go to [email or web address].   ? ?Your next appointment:   ?3 month(s) ? ?The format for your next appointment:   ?In Person ? ?Provider:   ? Kirk Ruths, MD, or Sande Rives, PA-C ? ?Other Instructions ? ?

## 2022-01-21 ENCOUNTER — Telehealth (HOSPITAL_COMMUNITY): Payer: Self-pay | Admitting: *Deleted

## 2022-01-21 NOTE — Telephone Encounter (Signed)
Close encounter 

## 2022-01-25 ENCOUNTER — Encounter (HOSPITAL_COMMUNITY): Payer: Self-pay | Admitting: Student

## 2022-01-25 ENCOUNTER — Inpatient Hospital Stay (HOSPITAL_COMMUNITY): Admission: RE | Admit: 2022-01-25 | Payer: PPO | Source: Ambulatory Visit

## 2022-01-25 ENCOUNTER — Ambulatory Visit (HOSPITAL_COMMUNITY): Admission: RE | Admit: 2022-01-25 | Payer: PPO | Source: Ambulatory Visit | Attending: Student | Admitting: Student

## 2022-01-26 ENCOUNTER — Telehealth (HOSPITAL_COMMUNITY): Payer: Self-pay | Admitting: *Deleted

## 2022-01-26 ENCOUNTER — Ambulatory Visit (HOSPITAL_COMMUNITY): Admission: RE | Admit: 2022-01-26 | Payer: PPO | Source: Ambulatory Visit | Attending: Student | Admitting: Student

## 2022-01-26 ENCOUNTER — Encounter (HOSPITAL_COMMUNITY): Payer: PPO

## 2022-01-26 NOTE — Telephone Encounter (Signed)
Close encounter 

## 2022-01-27 ENCOUNTER — Ambulatory Visit (HOSPITAL_COMMUNITY)
Admission: RE | Admit: 2022-01-27 | Discharge: 2022-01-27 | Disposition: A | Discharge status: Home / Self Care | Payer: PPO | Source: Ambulatory Visit | Attending: Cardiology | Admitting: Cardiology

## 2022-01-27 ENCOUNTER — Other Ambulatory Visit: Payer: Self-pay

## 2022-01-27 DIAGNOSIS — I5032 Chronic diastolic (congestive) heart failure: Secondary | ICD-10-CM | POA: Diagnosis not present

## 2022-01-27 DIAGNOSIS — I10 Essential (primary) hypertension: Secondary | ICD-10-CM | POA: Insufficient documentation

## 2022-01-27 DIAGNOSIS — I350 Nonrheumatic aortic (valve) stenosis: Secondary | ICD-10-CM | POA: Diagnosis not present

## 2022-01-27 MED ORDER — TECHNETIUM TC 99M TETROFOSMIN IV KIT
29.4000 | PACK | Freq: Once | INTRAVENOUS | Status: AC | PRN
  Administered 2022-01-27: 29.4 via INTRAVENOUS
  Filled 2022-01-27: qty 30

## 2022-01-27 MED ORDER — REGADENOSON 0.4 MG/5ML IV SOLN
0.4000 mg | Freq: Once | INTRAVENOUS | Status: AC
  Administered 2022-01-27: 0.4 mg via INTRAVENOUS

## 2022-01-28 ENCOUNTER — Ambulatory Visit (HOSPITAL_COMMUNITY)
Admission: RE | Admit: 2022-01-28 | Discharge: 2022-01-28 | Disposition: A | Discharge status: Home / Self Care | Payer: PPO | Source: Ambulatory Visit | Attending: Cardiovascular Disease | Admitting: Cardiovascular Disease

## 2022-01-28 LAB — MYOCARDIAL PERFUSION IMAGING
LV dias vol: 110 mL (ref 46–106)
LV sys vol: 47 mL
Nuc Stress EF: 58 %
Peak HR: 74 {beats}/min
Rest HR: 65 {beats}/min
Rest Nuclear Isotope Dose: 31 mCi
SDS: 2
SRS: 2
SSS: 4
ST Depression (mm): 0 mm
Stress Nuclear Isotope Dose: 29.4 mCi
TID: 1.07

## 2022-01-28 MED ORDER — TECHNETIUM TC 99M TETROFOSMIN IV KIT
31.0000 | PACK | Freq: Once | INTRAVENOUS | Status: AC | PRN
  Administered 2022-01-28: 31 via INTRAVENOUS

## 2022-02-01 DIAGNOSIS — R5383 Other fatigue: Secondary | ICD-10-CM | POA: Diagnosis not present

## 2022-02-01 DIAGNOSIS — R32 Unspecified urinary incontinence: Secondary | ICD-10-CM | POA: Diagnosis not present

## 2022-02-01 DIAGNOSIS — I10 Essential (primary) hypertension: Secondary | ICD-10-CM | POA: Diagnosis not present

## 2022-02-01 DIAGNOSIS — I700 Atherosclerosis of aorta: Secondary | ICD-10-CM | POA: Diagnosis not present

## 2022-02-01 DIAGNOSIS — I5032 Chronic diastolic (congestive) heart failure: Secondary | ICD-10-CM | POA: Diagnosis not present

## 2022-02-01 DIAGNOSIS — R0609 Other forms of dyspnea: Secondary | ICD-10-CM | POA: Diagnosis not present

## 2022-02-24 ENCOUNTER — Ambulatory Visit (INDEPENDENT_AMBULATORY_CARE_PROVIDER_SITE_OTHER): Payer: PPO | Admitting: Pulmonary Disease

## 2022-02-24 ENCOUNTER — Encounter: Payer: Self-pay | Admitting: Pulmonary Disease

## 2022-02-24 ENCOUNTER — Ambulatory Visit (INDEPENDENT_AMBULATORY_CARE_PROVIDER_SITE_OTHER): Payer: PPO

## 2022-02-24 VITALS — BP 130/72 | HR 68 | Temp 98.2°F | Ht 65.0 in | Wt 266.2 lb

## 2022-02-24 DIAGNOSIS — R0609 Other forms of dyspnea: Secondary | ICD-10-CM | POA: Diagnosis not present

## 2022-02-24 NOTE — Patient Instructions (Signed)
Nice to meet you ? ?We will get a Chest Xray today and schedule pulmonary function tests (PFT) soon to further evaluate your shortness of breath. ? ?PFT next available ? ?Return to clinic in 6 weeks or sooner as needed ?

## 2022-02-24 NOTE — Progress Notes (Signed)
? ?_0  ID: [name], female    DOB: Jan 29, 1941, 81 y.o.   MRN: [id number] ? ?Chief Complaint  ?Patient presents with  ? Consult  ?  Consult for DOE. Pt states she gets short of breath when she is up doing any activity. Pt states this has been occurring since July 2022. Pt is unaware what triggered it.   ? ? ?Referring provider: ?Ramiro Harvest, PA-C ? ?HPI:  ? ?81 y.o. consultation for evaluation of dyspnea on exertion.  Most recent cardiology note x2 reviewed.  Note from referring provider reviewed. ? ?Patient notes onset of dyspnea on July 2022.  Prior to that she was very active.  No issues with dyspnea.  She was in [place] and developed severe dyspnea.  Noted to be very hypertensive.  To check in to the emergency room.  Was treated.  Eventually placed on antihypertensive for high blood pressure.  Her dyspnea has persisted since.  1 of inclines or stairs.  Present on flat surfaces as well.  No time of day when things are better or worse.  No position that makes things better or worse.  No seasonal or environmental factors she can identify to make things better or worse.  No other alleviating or exacerbating factors.  She reports normal D-dimer at time of illness and [place], no cross-sectional imaging.  Reportedly had a clear chest x-ray at that time.  Further work-up including TTE 08/2021 demonstrated diastolic dysfunction, elevated left atrial pressure, signs of chronic left atrial volume overload with left atrial dilation, aortic stenosis.  She had a chemical stress test 01/2022 that on my review was negative or normal. ? ?She denies atopic symptoms or seasonal allergies.  Denies heartburn or GERD. ? ?PMH: Hypertension ?Surgical history: Knee replacement ?Family history: Mother with angina ?Social history: Never smoker, lives in [place] ? ? ?Questionaires / Pulmonary Flowsheets:  ? ?ACT:  ?   ? View : No data to display.  ?  ?  ?  ? ? ?MMRC: ?   ? View : No data to display.  ?  ?  ?   ? ? ?Epworth:  ?   ? View : No data to display.  ?  ?  ?  ? ? ?Tests:  ? ?FENO:  ?No results found for: NITRICOXIDE ? ?PFT: ?   ? View : No data to display.  ?  ?  ?  ? ? ?WALK:  ?   ? View : No data to display.  ?  ?  ?  ? ? ?Imaging: ?Personally reviewed and as per EMR discussion this note ?MYOCARDIAL PERFUSION IMAGING ? ?Result Date: 01/28/2022 ?  The study is normal. Findings are consistent with no prior ischemia and no prior myocardial infarction. The study is low risk.   No ST deviation was noted.   LV perfusion is normal. There is no evidence of ischemia. There is no evidence of infarction.   Left ventricular function is normal. Nuclear stress EF: 58 %. The left ventricular ejection fraction is normal (55-65%). End diastolic cavity size is normal. End systolic cavity size is normal.   Prior study not available for comparison.   ? ?Lab Results: ?Personally reviewed, labs 01/2022 at PCP office she presents at initial visit without anemia ?CBC ?   ?Component Value Date/Time  ? WBC 8.2 01/29/2009 0420  ? RBC 3.60 (L) 01/29/2009 0420  ? HGB 15.5 (H) 02/02/2011 1017  ? HCT 31.2 (L) 01/29/2009 0420  ? PLT 164  01/29/2009 0420  ? MCV 86.6 01/29/2009 0420  ? MCHC 33.5 01/29/2009 0420  ? RDW 14.0 01/29/2009 0420  ? ? ?BMET ?   ?Component Value Date/Time  ? NA 141 10/08/2021 1024  ? K 3.9 10/08/2021 1024  ? CL 102 10/08/2021 1024  ? CO2 26 10/08/2021 1024  ? GLUCOSE 98 10/08/2021 1024  ? GLUCOSE 111 (H) 01/29/2009 0420  ? BUN 18 10/08/2021 1024  ? CREATININE 0.91 10/08/2021 1024  ? CALCIUM 9.7 10/08/2021 1024  ? GFRNONAA >60 01/29/2009 0420  ? GFRAA  01/29/2009 0420  ?  >60        ?The eGFR has been calculated ?using the MDRD equation. ?This calculation has not been ?validated in all clinical ?situations. ?eGFR's persistently ?<60 mL/min signify ?possible Chronic Kidney Disease.  ? ? ?BNP ?No results found for: BNP ? ?ProBNP ?   ?Component Value Date/Time  ? PROBNP 323 10/08/2021 1024  ? ? ?Specialty Problems    ?None ? ? ?Allergies  ?Allergen Reactions  ? Gluten Meal Other (See Comments)  ?  Pt. Stated,"gives me sinuses."  ? Methylprednisolone Sodium Succ Other (See Comments)  ? ? ? ?There is no immunization history on file for this patient. ? ?Past Medical History:  ?Diagnosis Date  ? Hypertension   ? ? ?Tobacco History: ?Social History  ? ?Tobacco Use  ?Smoking Status Never  ? Passive exposure: Past  ?Smokeless Tobacco Never  ? ?Counseling given: Not Answered ? ? ?Continue to not smoke ? ?Outpatient Encounter Medications as of 02/24/2022  ?Medication Sig  ? metoprolol tartrate (LOPRESSOR) 50 MG tablet Take 50 mg by mouth 2 (two) times daily.  ? potassium gluconate 595 (99 K) MG TABS tablet TAKE 1 TABLET BY MOUTH EVERY DAY FOR 30 DAYS  ? torsemide (DEMADEX) 20 MG tablet Take 20 mg by mouth daily. Take 1 tablet daily and Take 2 tablets in the morning and 1 tablet in the evening every other day  ? vitamin B-12 (CYANOCOBALAMIN) 500 MCG tablet Take 500 mcg by mouth daily.  ? ?No facility-administered encounter medications on file as of 02/24/2022.  ? ? ? ?Review of Systems ? ?Review of Systems  ?Clinical exertion.  No orthopnea or PND.  Comprehensive review of systems otherwise negative. ? ?Physical Exam ? ?BP 130/72 (BP Location: Left Arm, Patient Position: Sitting, Cuff Size: Normal)   Pulse 68   Temp 98.2 ?F (36.8 ?C) (Oral)   Ht _0  (1.651 m)   Wt 266 lb 3.2 oz (120.7 kg)   SpO2 97%   BMI 44.30 kg/m?  ? ?Wt Readings from Last 5 Encounters:  ?02/24/22 266 lb 3.2 oz (120.7 kg)  ?01/27/22 264 lb (119.7 kg)  ?01/14/22 264 lb 3.2 oz (119.8 kg)  ?10/08/21 269 lb (122 kg)  ?08/18/21 267 lb 3.2 oz (121.2 kg)  ? ? ?BMI Readings from Last 5 Encounters:  ?02/24/22 44.30 kg/m?  ?01/27/22 42.61 kg/m?  ?01/14/22 42.64 kg/m?  ?10/08/21 43.42 kg/m?  ?08/18/21 43.13 kg/m?  ? ? ? ?Physical Exam ?General: Well-appearing, no acute distress ?Eyes: EOMI, no icterus ?Neck: Supple, no JVD appreciated ?Pulmonary: Clear, normal work of  breathing ?Cardiovascular: Regular in rhythm, no murmur ?Abdomen: Nontender, bowel sounds present ?MSK: No synovitis, no joint effusion ?Neuro: Normal gait, no weakness ?Psych: Normal mood, full affect ? ? ?Assessment & Plan:  ? ?DOE: suspect largely driven by cardiac causes related to diastolic dysfunction, elevated LAP, dilated LA, aortic stenosis. Initial event sparked by HTN episode increases suspicion.  Also likely worsened in setting of deconditioning as she is much less active over the last several months.  Chest x-ray and PFTs for further evaluation of potential pulmonary contributing. ? ? ?Return in about 6 weeks (around 04/07/2022). ? ? ?Lanier Clam, MD ?02/24/2022 ? ? ?This appointment required 61 minutes of patient care (this includes precharting, chart review, review of results, face-to-face care, etc.). ? ?

## 2022-03-21 DIAGNOSIS — I10 Essential (primary) hypertension: Secondary | ICD-10-CM | POA: Diagnosis not present

## 2022-03-21 DIAGNOSIS — I700 Atherosclerosis of aorta: Secondary | ICD-10-CM | POA: Diagnosis not present

## 2022-03-21 DIAGNOSIS — Z0000 Encounter for general adult medical examination without abnormal findings: Secondary | ICD-10-CM | POA: Diagnosis not present

## 2022-03-30 ENCOUNTER — Ambulatory Visit (INDEPENDENT_AMBULATORY_CARE_PROVIDER_SITE_OTHER): Payer: PPO | Admitting: Pulmonary Disease

## 2022-03-30 DIAGNOSIS — R0609 Other forms of dyspnea: Secondary | ICD-10-CM | POA: Diagnosis not present

## 2022-03-30 LAB — PULMONARY FUNCTION TEST
DL/VA % pred: 94 %
DL/VA: 3.79 ml/min/mmHg/L
DLCO cor % pred: 88 %
DLCO cor: 17.74 ml/min/mmHg
DLCO unc % pred: 88 %
DLCO unc: 17.74 ml/min/mmHg
FEF 25-75 Post: 2.32 L/sec
FEF 25-75 Pre: 2.05 L/sec
FEF2575-%Change-Post: 13 %
FEF2575-%Pred-Post: 157 %
FEF2575-%Pred-Pre: 138 %
FEV1-%Change-Post: 1 %
FEV1-%Pred-Post: 100 %
FEV1-%Pred-Pre: 99 %
FEV1-Post: 2.12 L
FEV1-Pre: 2.08 L
FEV1FVC-%Change-Post: 4 %
FEV1FVC-%Pred-Pre: 108 %
FEV6-%Change-Post: -2 %
FEV6-%Pred-Post: 95 %
FEV6-%Pred-Pre: 97 %
FEV6-Post: 2.54 L
FEV6-Pre: 2.6 L
FEV6FVC-%Pred-Post: 105 %
FEV6FVC-%Pred-Pre: 105 %
FVC-%Change-Post: -2 %
FVC-%Pred-Post: 90 %
FVC-%Pred-Pre: 92 %
FVC-Post: 2.54 L
FVC-Pre: 2.6 L
Post FEV1/FVC ratio: 84 %
Post FEV6/FVC ratio: 100 %
Pre FEV1/FVC ratio: 80 %
Pre FEV6/FVC Ratio: 100 %
RV % pred: 94 %
RV: 2.38 L
TLC % pred: 100 %
TLC: 5.4 L

## 2022-03-30 NOTE — Patient Instructions (Signed)
Performed Full PFT Today.   ?

## 2022-03-30 NOTE — Progress Notes (Signed)
Performed Full PFT Today.   ?

## 2022-04-12 DIAGNOSIS — L304 Erythema intertrigo: Secondary | ICD-10-CM | POA: Diagnosis not present

## 2022-04-12 DIAGNOSIS — L821 Other seborrheic keratosis: Secondary | ICD-10-CM | POA: Diagnosis not present

## 2022-04-12 DIAGNOSIS — L820 Inflamed seborrheic keratosis: Secondary | ICD-10-CM | POA: Diagnosis not present

## 2022-04-12 NOTE — Progress Notes (Signed)
HPI: FU CHF and tachycardia. By records she has refused all medications.  Patient apparently seen in [place] July 2022 with dyspnea on exertion and lower extremity edema.  By report her blood pressure was 192/93.  Laboratories including CBC, CMP, troponin and BNP normal though I do not have those records available.  Laboratories August 2022 showed sodium 138, potassium 4, BUN 20 and creatinine 0.75.  Liver functions normal.  Echocardiogram October 2022 showed vigorous LV function, mild left ventricular hypertrophy, grade 1 diastolic dysfunction, mild left atrial enlargement, mild aortic stenosis with mean gradient 12 mmHg and mildly dilated ascending aorta.  Seen recently for increased dyspnea on exertion.  Nuclear study March 2023 showed ejection fraction 58% and no infarction or ischemia.  Patient was referred to pulmonary.  Since last seen, her dyspnea has improved.  She denies orthopnea or PND.  She does have chronic pedal edema.  No chest pain or syncope.  Current Outpatient Medications  Medication Sig Dispense Refill   metoprolol tartrate (LOPRESSOR) 50 MG tablet Take 50 mg by mouth 2 (two) times daily.     potassium gluconate 595 (99 K) MG TABS tablet TAKE 1 TABLET BY MOUTH EVERY DAY FOR 30 DAYS     torsemide (DEMADEX) 20 MG tablet Take 20 mg by mouth daily. Take 1 tablet daily and Take 2 tablets in the morning and 1 tablet in the evening every other day     vitamin B-12 (CYANOCOBALAMIN) 500 MCG tablet Take 500 mcg by mouth daily.     No current facility-administered medications for this visit.     Past Medical History:  Diagnosis Date   Hypertension     Past Surgical History:  Procedure Laterality Date   DILATION AND CURETTAGE OF UTERUS     TOTAL KNEE ARTHROPLASTY Bilateral     Social History   Socioeconomic History   Marital status: Widowed    Spouse name: Not on file   Number of children: 3   Years of education: Not on file   Highest education level: Not on file   Occupational History   Not on file  Tobacco Use   Smoking status: Never    Passive exposure: Past   Smokeless tobacco: Never  Vaping Use   Vaping Use: Never used  Substance and Sexual Activity   Alcohol use: Yes    Comment: rare   Drug use: Never   Sexual activity: Not on file  Other Topics Concern   Not on file  Social History Narrative   Not on file   Social Determinants of Health   Financial Resource Strain: Not on file  Food Insecurity: Not on file  Transportation Needs: Not on file  Physical Activity: Not on file  Stress: Not on file  Social Connections: Not on file  Intimate Partner Violence: Not on file    Family History  Problem Relation Age of Onset   Angina Mother     ROS: no fevers or chills, productive cough, hemoptysis, dysphasia, odynophagia, melena, hematochezia, dysuria, hematuria, rash, seizure activity, orthopnea, PND, pedal edema, claudication. Remaining systems are negative.  Physical Exam: Well-developed obese in no acute distress.  Skin is warm and dry.  HEENT is normal.  Neck is supple.  Chest is clear to auscultation with normal expansion.  Cardiovascular exam is regular rate and rhythm.  Abdominal exam nontender or distended. No masses palpated. Extremities show 1+ edema. neuro grossly intact  ECG-normal sinus rhythm with PACs, left anterior fascicular block,  left ventricular hypertrophy.  Incomplete right bundle branch block.  Personally reviewed  A/P  1 dyspnea on exertion-etiology unclear.  Previous echocardiogram showed preserved LV function.  There was note of grade 1 diastolic dysfunction and mild aortic stenosis.  Nuclear study showed no ischemia.  Previous D-dimer minimally elevated but normal for patient's age.  ProBNP was normal.  Recent chest x-ray showed no infiltrate.  Likely multifactorial including obesity hypoventilation syndrome, possible contribution from sleep apnea which have asked her to discuss with pulmonary and  chronic diastolic congestive heart failure.  2 chronic diastolic congestive heart failure-volume status is reasonable.  We will continue diuretic at present dose.    3 hypertension-BP controlled; continue present meds and follow.  4 mild aortic stenosis-patient will require follow-up echoes in the future.  5 history of tachycardia-as outlined previously heart rate is improved.  We will continue beta-blocker.  Kirk Ruths, MD

## 2022-04-15 ENCOUNTER — Encounter: Payer: Self-pay | Admitting: Emergency Medicine

## 2022-04-15 ENCOUNTER — Emergency Department
Admission: EM | Admit: 2022-04-15 | Discharge: 2022-04-15 | Disposition: A | Discharge status: Home / Self Care | Payer: PPO | Source: Home / Self Care

## 2022-04-15 ENCOUNTER — Encounter: Payer: Self-pay | Admitting: Cardiology

## 2022-04-15 ENCOUNTER — Ambulatory Visit (INDEPENDENT_AMBULATORY_CARE_PROVIDER_SITE_OTHER): Payer: PPO | Admitting: Cardiology

## 2022-04-15 VITALS — BP 115/80 | HR 70 | Ht 66.0 in | Wt 261.0 lb

## 2022-04-15 DIAGNOSIS — I10 Essential (primary) hypertension: Secondary | ICD-10-CM

## 2022-04-15 DIAGNOSIS — I5032 Chronic diastolic (congestive) heart failure: Secondary | ICD-10-CM | POA: Diagnosis not present

## 2022-04-15 DIAGNOSIS — I350 Nonrheumatic aortic (valve) stenosis: Secondary | ICD-10-CM | POA: Diagnosis not present

## 2022-04-15 DIAGNOSIS — H6121 Impacted cerumen, right ear: Secondary | ICD-10-CM

## 2022-04-15 DIAGNOSIS — R0609 Other forms of dyspnea: Secondary | ICD-10-CM

## 2022-04-15 NOTE — ED Provider Notes (Signed)
[name]    CSN: [id number] Arrival date & time: 04/15/22  1502      History   Chief Complaint Chief Complaint  Patient presents with   Otalgia    HPI [name] is a 81 y.o. female.   81yo female presents today with concerns of blocked R ear. States she was trying to remove a piece of wax with a Q tip. Got a large chunk out, but pushed the remaining piece back in. Is unable to hear out of the right ear. Denies pain, drainage or swelling. No sx on L.    Otalgia   Past Medical History:  Diagnosis Date   Hypertension     Patient Active Problem List   Diagnosis Date Noted   Endometrial polyp 07/19/2016   Vaginal itching 07/19/2016   Morbid obesity with BMI of 40.0-44.9, adult ([place]) 06/22/2016   Postmenopausal bleeding 05/25/2016    Past Surgical History:  Procedure Laterality Date   DILATION AND CURETTAGE OF UTERUS     TOTAL KNEE ARTHROPLASTY Bilateral     OB History   No obstetric history on file.      Home Medications    Prior to Admission medications   Medication Sig Start Date End Date Taking? Authorizing Provider  metoprolol tartrate (LOPRESSOR) 50 MG tablet Take 50 mg by mouth 2 (two) times daily. 07/29/21  Yes [provider]  potassium gluconate 595 (99 K) MG TABS tablet TAKE 1 TABLET BY MOUTH EVERY DAY FOR 30 DAYS   Yes [provider]  torsemide (DEMADEX) 20 MG tablet Take 20 mg by mouth daily. Take 1 tablet daily and Take 2 tablets in the morning and 1 tablet in the evening every other day   Yes [provider]  vitamin B-12 (CYANOCOBALAMIN) 500 MCG tablet Take 500 mcg by mouth daily.   Yes [provider]    Family History Family History  Problem Relation Age of Onset   Angina Mother     Social History Social History   Tobacco Use   Smoking status: Never    Passive exposure: Past   Smokeless tobacco: Never  Vaping Use   Vaping Use: Never used  Substance Use Topics   Alcohol use:  Yes    Comment: rare   Drug use: Never     Allergies   Gluten meal and Methylprednisolone sodium succ   Review of Systems Review of Systems  HENT:  Positive for ear pain.      Physical Exam Triage Vital Signs ED Triage Vitals  Enc Vitals Group     BP 04/15/22 1533 (!) 148/85     Pulse Rate 04/15/22 1533 69     Resp 04/15/22 1533 18     Temp 04/15/22 1533 98.9 F (37.2 C)     Temp Source 04/15/22 1533 Oral     SpO2 04/15/22 1533 94 %     Weight 04/15/22 1535 257 lb (116.6 kg)     Height 04/15/22 1535 '5\' 5"'$  (1.651 m)     Head Circumference --      Peak Flow --      Pain Score 04/15/22 1534 0     Pain Loc --      Pain Edu? --      Excl. in [place]? --    No data found.  Updated Vital Signs BP (!) 148/85 (BP Location: Right Arm)   Pulse 69   Temp 98.9 F (37.2 C) (Oral)   Resp  18   Ht '5\' 5"'$  (1.651 m)   Wt 257 lb (116.6 kg)   SpO2 94%   BMI 42.77 kg/m   Visual Acuity Right Eye Distance:   Left Eye Distance:   Bilateral Distance:    Right Eye Near:   Left Eye Near:    Bilateral Near:     Physical Exam Vitals and nursing note reviewed.  Constitutional:      General: She is not in acute distress.    Appearance: Normal appearance. She is normal weight. She is not ill-appearing or toxic-appearing.  HENT:     Head: Normocephalic and atraumatic.     Salivary Glands: Right salivary gland is not diffusely enlarged or tender. Left salivary gland is not diffusely enlarged or tender.     Right Ear: Decreased hearing noted. No swelling or tenderness. No middle ear effusion. There is impacted cerumen. Tympanic membrane is not injected, scarred, perforated or erythematous.     Left Ear: Hearing, tympanic membrane, ear canal and external ear normal. No decreased hearing noted. No drainage, swelling or tenderness.  No middle ear effusion. There is no impacted cerumen. No hemotympanum. Tympanic membrane is not injected, scarred, perforated, erythematous or retracted.      Ears:     Comments: Hearing improved once cerumen removed. No edema or discharge of canal. Neurological:     Mental Status: She is alert.      UC Treatments / Results  Labs (all labs ordered are listed, but only abnormal results are displayed) Labs Reviewed - No data to display  EKG   Radiology No results found.  Procedures Ear Cerumen Removal  Date/Time: 04/15/2022 7:22 PM  Performed by: Chaney Malling, PA Authorized by: Chaney Malling, PA   Consent:    Consent obtained:  Verbal   Consent given by:  Patient   Risks discussed:  Bleeding, dizziness, infection, pain, TM perforation and incomplete removal   Alternatives discussed:  No treatment Universal protocol:    Procedure explained and questions answered to patient or proxy's satisfaction: yes     Patient identity confirmed:  Verbally with patient Procedure details:    Location:  R ear   Procedure type: irrigation     Procedure outcomes: cerumen removed   Post-procedure details:    Inspection:  No bleeding, ear canal clear and TM intact   Hearing quality:  Improved   Procedure completion:  Tolerated  (including critical care time)  Medications Ordered in UC Medications - No data to display  Initial Impression / Assessment and Plan / UC Course  I have reviewed the triage vital signs and the nursing notes.  Pertinent labs & imaging results that were available during my care of the patient were reviewed by me and considered in my medical decision making (see chart for details).     R cerumen impaction - removed in office successfully without complication. Hearing restored, no active signs of infection.  Final Clinical Impressions(s) / UC Diagnoses   Final diagnoses:  Impacted cerumen of right ear     Discharge Instructions      Your ear wax was removed in our office today. There is no evidence of infection, however if pain occurs on the R in the next 2-3 days, please contact our office.   ED  Prescriptions   None    PDMP not reviewed this encounter.   Chaney Malling, [place] 04/15/22 1927

## 2022-04-15 NOTE — Discharge Instructions (Addendum)
Your ear wax was removed in our office today. There is no evidence of infection, however if pain occurs on the R in the next 2-3 days, please contact our office.

## 2022-04-15 NOTE — Patient Instructions (Signed)

## 2022-04-15 NOTE — ED Triage Notes (Signed)
Patient c/o right ear pain/blockage x 2 days.  No other sxs.  Patient denies any OTC meds for ear.

## 2022-04-19 ENCOUNTER — Ambulatory Visit: Payer: PPO | Admitting: Student

## 2022-04-20 ENCOUNTER — Ambulatory Visit: Payer: PPO | Admitting: Pulmonary Disease

## 2022-04-20 ENCOUNTER — Encounter: Payer: Self-pay | Admitting: Pulmonary Disease

## 2022-04-20 VITALS — BP 128/64 | HR 83 | Temp 98.6°F | Ht 66.0 in | Wt 262.4 lb

## 2022-04-20 DIAGNOSIS — R0609 Other forms of dyspnea: Secondary | ICD-10-CM

## 2022-04-20 NOTE — Patient Instructions (Signed)
PFT normal  F/u as needed

## 2022-04-20 NOTE — Progress Notes (Signed)
_0  ID: [name], female    DOB: 06-09-1941, 81 y.o.   MRN: [id number]  Chief Complaint  Patient presents with   Follow-up    Pt is here for follow up for her DOE. Pt states that she feels like her breathing is better since last visit. Pt is not on any inhalers at the moment. She states she is doing better.     Referring provider: Orpah Melter, MD  HPI:   81 y.o. whom we are seeing in follow up for evaluation of dyspnea on exertion.  Most recent cardiology note reviewed.    Doing ok. PFT obtained in interim since last visit. Reviewed in detail with patient. Interpreted today as normal PFTs. She denies any DOE. She ie exercising regularly. MWF water aerobics for 1 hour.   HPI at initial visit: Patient notes onset of dyspnea on July 2022.  Prior to that she was very active.  No issues with dyspnea.  She was in [place] and developed severe dyspnea.  Noted to be very hypertensive.  To check in to the emergency room.  Was treated.  Eventually placed on antihypertensive for high blood pressure.  Her dyspnea has persisted since.  1 of inclines or stairs.  Present on flat surfaces as well.  No time of day when things are better or worse.  No position that makes things better or worse.  No seasonal or environmental factors she can identify to make things better or worse.  No other alleviating or exacerbating factors.  She reports normal D-dimer at time of illness and [place], no cross-sectional imaging.  Reportedly had a clear chest x-ray at that time.  Further work-up including TTE 08/2021 demonstrated diastolic dysfunction, elevated left atrial pressure, signs of chronic left atrial volume overload with left atrial dilation, aortic stenosis.  She had a chemical stress test 01/2022 that on my review was negative or normal.  She denies atopic symptoms or seasonal allergies.  Denies heartburn or GERD.  PMH: Hypertension Surgical history: Knee replacement Family history: Mother with  angina Social history: Never smoker, lives in [place] / Pulmonary Flowsheets:   ACT:      No data to display          MMRC:     No data to display          Epworth:      No data to display          Tests:   FENO:  No results found for: "NITRICOXIDE"  PFT:    Latest Ref Rng & Units 03/30/2022    8:27 AM  PFT Results  FVC-Pre L 2.60   FVC-Predicted Pre % 92   FVC-Post L 2.54   FVC-Predicted Post % 90   Pre FEV1/FVC % % 80   Post FEV1/FCV % % 84   FEV1-Pre L 2.08   FEV1-Predicted Pre % 99   FEV1-Post L 2.12   DLCO uncorrected ml/min/mmHg 17.74   DLCO UNC% % 88   DLCO corrected ml/min/mmHg 17.74   DLCO COR %Predicted % 88   DLVA Predicted % 94   TLC L 5.40   TLC % Predicted % 100   RV % Predicted % 94   Personally Reviewed and interpreted as normal spirometry, no bronchodilator response.  Lung volumes within normal limits.  DLCO within the limits.  Normal PFTs.  WALK:      No data to display  Imaging: Personally reviewed and as per EMR discussion this note No results found.  Lab Results: Personally reviewed, labs 01/2022 at PCP office she presents at initial visit without anemia CBC    Component Value Date/Time   WBC 8.2 01/29/2009 0420   RBC 3.60 (L) 01/29/2009 0420   HGB 15.5 (H) 02/02/2011 1017   HCT 31.2 (L) 01/29/2009 0420   PLT 164 01/29/2009 0420   MCV 86.6 01/29/2009 0420   MCHC 33.5 01/29/2009 0420   RDW 14.0 01/29/2009 0420    BMET    Component Value Date/Time   NA 141 10/08/2021 1024   K 3.9 10/08/2021 1024   CL 102 10/08/2021 1024   CO2 26 10/08/2021 1024   GLUCOSE 98 10/08/2021 1024   GLUCOSE 111 (H) 01/29/2009 0420   BUN 18 10/08/2021 1024   CREATININE 0.91 10/08/2021 1024   CALCIUM 9.7 10/08/2021 1024   GFRNONAA >60 01/29/2009 0420   GFRAA  01/29/2009 0420    >60        The eGFR has been calculated using the MDRD equation. This calculation has not been validated in all  clinical situations. eGFR's persistently <60 mL/min signify possible Chronic Kidney Disease.    BNP No results found for: "BNP"  ProBNP    Component Value Date/Time   PROBNP 323 10/08/2021 1024    Specialty Problems   None   Allergies  Allergen Reactions   Gluten Meal Other (See Comments)    Pt. Stated,"gives me sinuses."   Methylprednisolone Sodium Succ Other (See Comments)     There is no immunization history on file for this patient.  Past Medical History:  Diagnosis Date   Hypertension     Tobacco History: Social History   Tobacco Use  Smoking Status Never   Passive exposure: Past  Smokeless Tobacco Never   Counseling given: Not Answered   Continue to not smoke  Outpatient Encounter Medications as of 04/20/2022  Medication Sig   metoprolol tartrate (LOPRESSOR) 50 MG tablet Take 50 mg by mouth 2 (two) times daily.   potassium gluconate 595 (99 K) MG TABS tablet TAKE 1 TABLET BY MOUTH EVERY DAY FOR 30 DAYS   torsemide (DEMADEX) 20 MG tablet Take 20 mg by mouth daily. Take 1 tablet daily and Take 2 tablets in the morning and 1 tablet in the evening every other day   vitamin B-12 (CYANOCOBALAMIN) 500 MCG tablet Take 500 mcg by mouth daily.   No facility-administered encounter medications on file as of 04/20/2022.     Review of Systems  Review of Systems  N/a  Physical Exam  BP 128/64 (BP Location: Right Arm, Patient Position: Sitting, Cuff Size: Normal)   Pulse 83   Temp 98.6 F (37 C) (Oral)   Ht _0  (1.676 m)   Wt 262 lb 6.4 oz (119 kg)   SpO2 97%   BMI 42.35 kg/m   Wt Readings from Last 5 Encounters:  04/20/22 262 lb 6.4 oz (119 kg)  04/15/22 257 lb (116.6 kg)  04/15/22 261 lb (118.4 kg)  02/24/22 266 lb 3.2 oz (120.7 kg)  01/27/22 264 lb (119.7 kg)    BMI Readings from Last 5 Encounters:  04/20/22 42.35 kg/m  04/15/22 42.77 kg/m  04/15/22 42.13 kg/m  02/24/22 44.30 kg/m  01/27/22 42.61 kg/m     Physical  Exam General: Well-appearing, no acute distress Eyes: EOMI, no icterus Neck: Supple, no JVD appreciated Pulmonary: Clear, normal work of breathing Cardiovascular: Regular in rhythm, no murmur  Abdomen: Nontender, bowel sounds present MSK: No synovitis, no joint effusion Neuro: Normal gait, no weakness Psych: Normal mood, full affect   Assessment & Plan:   DOE: Seems largely given by deconditioning.  Has improved over the last couple months.  Coincides with water aerobic classes, doing Monday Wednesday Friday.  Encouraged her to continue exercising.  May be add on additional activities on Tuesday and Thursday, walking [place] etc.  Possible initial presentation several months ago related to cardiac issues given in the setting of hypertensive episodes.  She does have diastolic dysfunction, elevated left atrial pressure (estimated on TTE) and dilated left atrium, aortic stenosis.  PFTs normal.  Chest imaging clear.   Return if symptoms worsen or fail to improve.   Lanier Clam, MD 04/20/2022

## 2022-05-03 DIAGNOSIS — I499 Cardiac arrhythmia, unspecified: Secondary | ICD-10-CM | POA: Diagnosis not present

## 2022-06-06 DIAGNOSIS — M109 Gout, unspecified: Secondary | ICD-10-CM | POA: Diagnosis not present

## 2022-06-06 DIAGNOSIS — I5032 Chronic diastolic (congestive) heart failure: Secondary | ICD-10-CM | POA: Diagnosis not present

## 2022-08-04 DIAGNOSIS — I5032 Chronic diastolic (congestive) heart failure: Secondary | ICD-10-CM | POA: Diagnosis not present

## 2022-08-04 DIAGNOSIS — I10 Essential (primary) hypertension: Secondary | ICD-10-CM | POA: Diagnosis not present

## 2022-08-04 DIAGNOSIS — I700 Atherosclerosis of aorta: Secondary | ICD-10-CM | POA: Diagnosis not present

## 2022-08-16 DIAGNOSIS — Z961 Presence of intraocular lens: Secondary | ICD-10-CM | POA: Diagnosis not present

## 2022-08-16 DIAGNOSIS — H524 Presbyopia: Secondary | ICD-10-CM | POA: Diagnosis not present

## 2022-08-16 DIAGNOSIS — H52203 Unspecified astigmatism, bilateral: Secondary | ICD-10-CM | POA: Diagnosis not present

## 2022-11-08 DIAGNOSIS — I5032 Chronic diastolic (congestive) heart failure: Secondary | ICD-10-CM | POA: Diagnosis not present

## 2022-11-08 DIAGNOSIS — I700 Atherosclerosis of aorta: Secondary | ICD-10-CM | POA: Diagnosis not present

## 2022-11-08 DIAGNOSIS — R7301 Impaired fasting glucose: Secondary | ICD-10-CM | POA: Diagnosis not present

## 2022-11-08 DIAGNOSIS — M79672 Pain in left foot: Secondary | ICD-10-CM | POA: Diagnosis not present

## 2022-11-08 DIAGNOSIS — I10 Essential (primary) hypertension: Secondary | ICD-10-CM | POA: Diagnosis not present

## 2022-12-02 DIAGNOSIS — M19031 Primary osteoarthritis, right wrist: Secondary | ICD-10-CM | POA: Diagnosis not present

## 2022-12-02 DIAGNOSIS — M50323 Other cervical disc degeneration at C6-C7 level: Secondary | ICD-10-CM | POA: Diagnosis not present

## 2022-12-02 DIAGNOSIS — M47812 Spondylosis without myelopathy or radiculopathy, cervical region: Secondary | ICD-10-CM | POA: Diagnosis not present

## 2022-12-02 DIAGNOSIS — M79641 Pain in right hand: Secondary | ICD-10-CM | POA: Diagnosis not present

## 2022-12-02 DIAGNOSIS — M65321 Trigger finger, right index finger: Secondary | ICD-10-CM | POA: Diagnosis not present

## 2022-12-02 DIAGNOSIS — M50322 Other cervical disc degeneration at C5-C6 level: Secondary | ICD-10-CM | POA: Diagnosis not present

## 2022-12-16 DIAGNOSIS — R201 Hypoesthesia of skin: Secondary | ICD-10-CM | POA: Diagnosis not present

## 2022-12-16 DIAGNOSIS — Z7409 Other reduced mobility: Secondary | ICD-10-CM | POA: Diagnosis not present

## 2022-12-16 DIAGNOSIS — Z789 Other specified health status: Secondary | ICD-10-CM | POA: Diagnosis not present

## 2022-12-16 DIAGNOSIS — M47812 Spondylosis without myelopathy or radiculopathy, cervical region: Secondary | ICD-10-CM | POA: Diagnosis not present

## 2022-12-16 DIAGNOSIS — R29898 Other symptoms and signs involving the musculoskeletal system: Secondary | ICD-10-CM | POA: Diagnosis not present

## 2022-12-21 DIAGNOSIS — R201 Hypoesthesia of skin: Secondary | ICD-10-CM | POA: Diagnosis not present

## 2022-12-21 DIAGNOSIS — M47812 Spondylosis without myelopathy or radiculopathy, cervical region: Secondary | ICD-10-CM | POA: Diagnosis not present

## 2022-12-21 DIAGNOSIS — R29898 Other symptoms and signs involving the musculoskeletal system: Secondary | ICD-10-CM | POA: Diagnosis not present

## 2022-12-21 DIAGNOSIS — Z789 Other specified health status: Secondary | ICD-10-CM | POA: Diagnosis not present

## 2022-12-21 DIAGNOSIS — Z7409 Other reduced mobility: Secondary | ICD-10-CM | POA: Diagnosis not present

## 2022-12-22 DIAGNOSIS — R201 Hypoesthesia of skin: Secondary | ICD-10-CM | POA: Diagnosis not present

## 2022-12-22 DIAGNOSIS — M47812 Spondylosis without myelopathy or radiculopathy, cervical region: Secondary | ICD-10-CM | POA: Diagnosis not present

## 2022-12-22 DIAGNOSIS — Z789 Other specified health status: Secondary | ICD-10-CM | POA: Diagnosis not present

## 2022-12-22 DIAGNOSIS — Z7409 Other reduced mobility: Secondary | ICD-10-CM | POA: Diagnosis not present

## 2022-12-22 DIAGNOSIS — R29898 Other symptoms and signs involving the musculoskeletal system: Secondary | ICD-10-CM | POA: Diagnosis not present

## 2022-12-28 DIAGNOSIS — M47812 Spondylosis without myelopathy or radiculopathy, cervical region: Secondary | ICD-10-CM | POA: Diagnosis not present

## 2022-12-28 DIAGNOSIS — Z7409 Other reduced mobility: Secondary | ICD-10-CM | POA: Diagnosis not present

## 2022-12-28 DIAGNOSIS — Z789 Other specified health status: Secondary | ICD-10-CM | POA: Diagnosis not present

## 2022-12-28 DIAGNOSIS — R29898 Other symptoms and signs involving the musculoskeletal system: Secondary | ICD-10-CM | POA: Diagnosis not present

## 2022-12-28 DIAGNOSIS — R201 Hypoesthesia of skin: Secondary | ICD-10-CM | POA: Diagnosis not present

## 2022-12-30 DIAGNOSIS — Z789 Other specified health status: Secondary | ICD-10-CM | POA: Diagnosis not present

## 2022-12-30 DIAGNOSIS — R201 Hypoesthesia of skin: Secondary | ICD-10-CM | POA: Diagnosis not present

## 2022-12-30 DIAGNOSIS — M47812 Spondylosis without myelopathy or radiculopathy, cervical region: Secondary | ICD-10-CM | POA: Diagnosis not present

## 2022-12-30 DIAGNOSIS — Z7409 Other reduced mobility: Secondary | ICD-10-CM | POA: Diagnosis not present

## 2022-12-30 DIAGNOSIS — R29898 Other symptoms and signs involving the musculoskeletal system: Secondary | ICD-10-CM | POA: Diagnosis not present

## 2023-01-04 DIAGNOSIS — M47812 Spondylosis without myelopathy or radiculopathy, cervical region: Secondary | ICD-10-CM | POA: Diagnosis not present

## 2023-01-04 DIAGNOSIS — R201 Hypoesthesia of skin: Secondary | ICD-10-CM | POA: Diagnosis not present

## 2023-01-04 DIAGNOSIS — Z7409 Other reduced mobility: Secondary | ICD-10-CM | POA: Diagnosis not present

## 2023-01-04 DIAGNOSIS — R29898 Other symptoms and signs involving the musculoskeletal system: Secondary | ICD-10-CM | POA: Diagnosis not present

## 2023-01-04 DIAGNOSIS — Z789 Other specified health status: Secondary | ICD-10-CM | POA: Diagnosis not present

## 2023-01-06 DIAGNOSIS — Z7409 Other reduced mobility: Secondary | ICD-10-CM | POA: Diagnosis not present

## 2023-01-06 DIAGNOSIS — Z789 Other specified health status: Secondary | ICD-10-CM | POA: Diagnosis not present

## 2023-01-06 DIAGNOSIS — M47812 Spondylosis without myelopathy or radiculopathy, cervical region: Secondary | ICD-10-CM | POA: Diagnosis not present

## 2023-01-06 DIAGNOSIS — R201 Hypoesthesia of skin: Secondary | ICD-10-CM | POA: Diagnosis not present

## 2023-01-06 DIAGNOSIS — R29898 Other symptoms and signs involving the musculoskeletal system: Secondary | ICD-10-CM | POA: Diagnosis not present

## 2023-01-09 DIAGNOSIS — E790 Hyperuricemia without signs of inflammatory arthritis and tophaceous disease: Secondary | ICD-10-CM | POA: Diagnosis not present

## 2023-01-09 DIAGNOSIS — I10 Essential (primary) hypertension: Secondary | ICD-10-CM | POA: Diagnosis not present

## 2023-01-09 DIAGNOSIS — I5032 Chronic diastolic (congestive) heart failure: Secondary | ICD-10-CM | POA: Diagnosis not present

## 2023-01-09 DIAGNOSIS — Z6841 Body Mass Index (BMI) 40.0 and over, adult: Secondary | ICD-10-CM | POA: Diagnosis not present

## 2023-01-09 DIAGNOSIS — I110 Hypertensive heart disease with heart failure: Secondary | ICD-10-CM | POA: Diagnosis not present

## 2023-01-09 LAB — LAB REPORT - SCANNED
A1c: 5.7
EGFR: 56

## 2023-01-10 DIAGNOSIS — Z7409 Other reduced mobility: Secondary | ICD-10-CM | POA: Diagnosis not present

## 2023-01-10 DIAGNOSIS — M47812 Spondylosis without myelopathy or radiculopathy, cervical region: Secondary | ICD-10-CM | POA: Diagnosis not present

## 2023-01-10 DIAGNOSIS — R29898 Other symptoms and signs involving the musculoskeletal system: Secondary | ICD-10-CM | POA: Diagnosis not present

## 2023-01-12 DIAGNOSIS — Z7409 Other reduced mobility: Secondary | ICD-10-CM | POA: Diagnosis not present

## 2023-01-12 DIAGNOSIS — R29898 Other symptoms and signs involving the musculoskeletal system: Secondary | ICD-10-CM | POA: Diagnosis not present

## 2023-01-12 DIAGNOSIS — M47812 Spondylosis without myelopathy or radiculopathy, cervical region: Secondary | ICD-10-CM | POA: Diagnosis not present

## 2023-01-17 DIAGNOSIS — Z7409 Other reduced mobility: Secondary | ICD-10-CM | POA: Diagnosis not present

## 2023-01-17 DIAGNOSIS — R29898 Other symptoms and signs involving the musculoskeletal system: Secondary | ICD-10-CM | POA: Diagnosis not present

## 2023-01-17 DIAGNOSIS — M47812 Spondylosis without myelopathy or radiculopathy, cervical region: Secondary | ICD-10-CM | POA: Diagnosis not present

## 2023-01-19 DIAGNOSIS — R29898 Other symptoms and signs involving the musculoskeletal system: Secondary | ICD-10-CM | POA: Diagnosis not present

## 2023-01-19 DIAGNOSIS — Z789 Other specified health status: Secondary | ICD-10-CM | POA: Diagnosis not present

## 2023-01-19 DIAGNOSIS — M47812 Spondylosis without myelopathy or radiculopathy, cervical region: Secondary | ICD-10-CM | POA: Diagnosis not present

## 2023-01-19 DIAGNOSIS — Z7409 Other reduced mobility: Secondary | ICD-10-CM | POA: Diagnosis not present

## 2023-01-24 DIAGNOSIS — R29898 Other symptoms and signs involving the musculoskeletal system: Secondary | ICD-10-CM | POA: Diagnosis not present

## 2023-01-24 DIAGNOSIS — M47812 Spondylosis without myelopathy or radiculopathy, cervical region: Secondary | ICD-10-CM | POA: Diagnosis not present

## 2023-01-24 DIAGNOSIS — Z789 Other specified health status: Secondary | ICD-10-CM | POA: Diagnosis not present

## 2023-01-24 DIAGNOSIS — Z7409 Other reduced mobility: Secondary | ICD-10-CM | POA: Diagnosis not present

## 2023-01-26 DIAGNOSIS — M47812 Spondylosis without myelopathy or radiculopathy, cervical region: Secondary | ICD-10-CM | POA: Diagnosis not present

## 2023-01-26 DIAGNOSIS — R29898 Other symptoms and signs involving the musculoskeletal system: Secondary | ICD-10-CM | POA: Diagnosis not present

## 2023-01-26 DIAGNOSIS — Z789 Other specified health status: Secondary | ICD-10-CM | POA: Diagnosis not present

## 2023-01-26 DIAGNOSIS — Z7409 Other reduced mobility: Secondary | ICD-10-CM | POA: Diagnosis not present

## 2023-01-30 ENCOUNTER — Telehealth: Payer: Self-pay | Admitting: Cardiology

## 2023-01-30 DIAGNOSIS — Z789 Other specified health status: Secondary | ICD-10-CM | POA: Diagnosis not present

## 2023-01-30 DIAGNOSIS — M47812 Spondylosis without myelopathy or radiculopathy, cervical region: Secondary | ICD-10-CM | POA: Diagnosis not present

## 2023-01-30 DIAGNOSIS — R29898 Other symptoms and signs involving the musculoskeletal system: Secondary | ICD-10-CM | POA: Diagnosis not present

## 2023-01-30 DIAGNOSIS — Z7409 Other reduced mobility: Secondary | ICD-10-CM | POA: Diagnosis not present

## 2023-01-30 NOTE — Telephone Encounter (Signed)
Spoke with patient - advised MD suggested an appt. Scheduled for 3/28 @ 1:30pm with Vella Raring FNP

## 2023-01-30 NOTE — Telephone Encounter (Signed)
Pt c/o Shortness Of Breath: STAT if SOB developed within the last 24 hours or pt is noticeably SOB on the phone  1. Are you currently SOB (can you hear that pt is SOB on the phone)? no  2. How long have you been experiencing SOB? Patient states started a year ago but has been getting worse over the past couple of months.   3. Are you SOB when sitting or when up moving around? When she is walking, states when she walks to her daughters house it will take 15-20 mins to get her breath back.    4. Are you currently experiencing any other symptoms? Patient states she is having no other symptoms.

## 2023-01-30 NOTE — Telephone Encounter (Signed)
Patient states her SOB has increased over the last year and especially last 2 months.  Swelling has resolved in right foot, slight swelling in left but almost resolved. No other symptoms. Not out of breath on phone.  Last visit was June 2023.  Last PFT's May 2023.  No labs on file since 2022, she will have recents labs X2 sent from her PCP to our office for review. She is currently on meds as ordered with exception of Torsemide ONCE daily and does not do the Two times a day (every other day) as directed.  She has also started Allopurinol for prevention of gout flare ups.  Allopurinol 100 mg @ pills Daily.  Advised I would send this to the provider for review and that she will most likely need an appt. But will wait until recommendations from provider are received.

## 2023-01-31 DIAGNOSIS — Z7409 Other reduced mobility: Secondary | ICD-10-CM | POA: Diagnosis not present

## 2023-01-31 DIAGNOSIS — M47812 Spondylosis without myelopathy or radiculopathy, cervical region: Secondary | ICD-10-CM | POA: Diagnosis not present

## 2023-01-31 DIAGNOSIS — Z789 Other specified health status: Secondary | ICD-10-CM | POA: Diagnosis not present

## 2023-01-31 DIAGNOSIS — R29898 Other symptoms and signs involving the musculoskeletal system: Secondary | ICD-10-CM | POA: Diagnosis not present

## 2023-02-02 ENCOUNTER — Encounter (HOSPITAL_BASED_OUTPATIENT_CLINIC_OR_DEPARTMENT_OTHER): Payer: Self-pay | Admitting: Family

## 2023-02-02 ENCOUNTER — Ambulatory Visit (HOSPITAL_BASED_OUTPATIENT_CLINIC_OR_DEPARTMENT_OTHER): Payer: PPO | Admitting: Family

## 2023-02-02 VITALS — BP 134/82 | HR 78 | Ht 66.0 in | Wt 268.0 lb

## 2023-02-02 DIAGNOSIS — R29898 Other symptoms and signs involving the musculoskeletal system: Secondary | ICD-10-CM | POA: Diagnosis not present

## 2023-02-02 DIAGNOSIS — I350 Nonrheumatic aortic (valve) stenosis: Secondary | ICD-10-CM | POA: Diagnosis not present

## 2023-02-02 DIAGNOSIS — I5032 Chronic diastolic (congestive) heart failure: Secondary | ICD-10-CM

## 2023-02-02 DIAGNOSIS — M47812 Spondylosis without myelopathy or radiculopathy, cervical region: Secondary | ICD-10-CM | POA: Diagnosis not present

## 2023-02-02 DIAGNOSIS — R400 Somnolence: Secondary | ICD-10-CM | POA: Diagnosis not present

## 2023-02-02 DIAGNOSIS — R0609 Other forms of dyspnea: Secondary | ICD-10-CM

## 2023-02-02 NOTE — Progress Notes (Signed)
Office Visit    Patient Name: [name] Date of Encounter: 02/02/2023  PCP:  Orpah Melter, MD   [facility]  Cardiologist:  Kirk Ruths, MD  Advanced Practice Provider:  No care team member to display Electrophysiologist:  None      Chief Complaint    [name] is a 82 y.o. female presents today for dyspnea   Past Medical History    Past Medical History:  Diagnosis Date   Hypertension    Past Surgical History:  Procedure Laterality Date   DILATION AND CURETTAGE OF UTERUS     TOTAL KNEE ARTHROPLASTY Bilateral    Allergies  Allergies  Allergen Reactions   Gluten Meal Other (See Comments)    Pt. Stated,"gives me sinuses."   Methylprednisolone Sodium Succ Other (See Comments)    History of Present Illness    [name] is a 82 y.o. female with a hx of mild aortic stenosis, tachycardia, hypertension, diastolic heart failure last seen 04/15/2022 by Dr. Stanford Breed.  Echocardiogram October 2022 vigorous LV function, mild LVH, grade 1 diastolic dysfunction, mild left atrial enlargement, mild aortic stenosis with mean gradient 12 mmHg and mildly dilated ascending aorta.  Nuclear study 01/2022 LVEF 58%, no infarction or ischemia.  Previously had PFTs 04/20/2022 which were unremarkable.  She last saw Dr. Stanford Breed 04/15/2022 at which time her dyspnea was felt to be multifactorial including obesity hypoventilation syndrome, chronic diastolic heart failure, possible sleep apnea.  Diuretic continued at present dose.  Contacted the office 01/30/2023 noting increasing dyspnea over the last year.  Right side swelling resolved and left foot swelling much improved.  Noted to be taking torsemide once daily instead of twice a day every other day as directed.  She presents today for follow-up. Notes episodes started in [place] in 2001 when she presented to the ED with dyspnea and prior to that was "picture of model health". She was started on  metoprolol but notes she did not take as she was not sure what it was. Later started at the direction of her PCP.   Tells me breathing had gotten better. But for the last few weeks has noted more dyspnea with walking "on land". Exercises by walking an hour in the water 5 days per week and does not note dyspnea with this activity. However, notes when she is walking into the exercise facility will have dyspnea. Symptoms improve when she sits and rests.   She also notes decrease in stamina. Feels she cannot do things like work in her garden. Notes minimal wheezing. No recent infection. No orthopnea, no PND. Notes bilateral lower extremity edema. Notes her right leg has nearly resolved. Her left leg still has some moderate swelling. Notes her weight at home is 10 pounds higher than weight one week ago at PCP office visit. Does not weigh routinely at home.   She is taking Torsemide 2 tablets (40mg ) in the morning but not afternoon dose. She does note occasionally missing her Torsemide. Takes her Metoprolol twice per day with heart rate at home mostly 60s via smart watch. Reports no palpitations, chest pain.   Reports some episodes of lightheadedness with bending over to stand up. Describes as "momentary dizziness" which resolved with Meclizine. She reports no near syncope, syncope.   Notes she goes to bed around 2AM and is up by 5-6AM.  Lives alone so she is not certain if she snores. Notes daytime somnolence often needing nap. Tells me "I would  not be happy with CPAP at all".  EKGs/Labs/Other Studies Reviewed:   The following studies were reviewed today:  Cardiac Studies & Procedures     STRESS TESTS  MYOCARDIAL PERFUSION IMAGING 01/28/2022  Narrative   The study is normal. Findings are consistent with no prior ischemia and no prior myocardial infarction. The study is low risk.   No ST deviation was noted.   LV perfusion is normal. There is no evidence of ischemia. There is no evidence of  infarction.   Left ventricular function is normal. Nuclear stress EF: 58 %. The left ventricular ejection fraction is normal (55-65%). End diastolic cavity size is normal. End systolic cavity size is normal.   Prior study not available for comparison.   ECHOCARDIOGRAM  ECHOCARDIOGRAM COMPLETE 09/02/2021  Narrative ECHOCARDIOGRAM REPORT    Patient Name:   [name] Date of Exam: 09/02/2021 Medical Rec #:  [id number]        Height:       66.0 in Accession #:    [id number]       Weight:       267.2 lb Date of Birth:  12-28-1940         BSA:          2.262 m Patient Age:    24 years         BP:           148/82 mmHg Patient Gender: F                HR:           76 bpm. Exam Location:  [address]  Procedure: 2D Echo, Cardiac Doppler, Color Doppler and Intracardiac Opacification Agent  Indications:    R01.1 Murmur  History:        Patient has no prior history of Echocardiogram examinations. Diastolic CHF, Signs/Symptoms:Systolic murmur [id number] LSB to cartoids, Edema and Chest Pain; Risk Factors:Hypertension and obesity.  Sonographer:    Lenard Galloway BA, RDCS Referring Phys: Denice Bors CRENSHAW  IMPRESSIONS   1. Left ventricular ejection fraction, by estimation, is >75%. The left ventricle has hyperdynamic function. The left ventricle has no regional wall motion abnormalities. There is mild left ventricular hypertrophy. Left ventricular diastolic parameters are consistent with Grade I diastolic dysfunction (impaired relaxation). Elevated left atrial pressure. 2. Right ventricular systolic function is normal. The right ventricular size is normal. 3. Left atrial size was mildly dilated. 4. Trivial mitral valve regurgitation. Moderate mitral annular calcification. 5. AV is thickened, calcified with restricted motion Peak and mean gradients through the valve are 22 and 12 mm Hg respectively. Dimensionless index is 0.44. COnsistent with mild AS> . The aortic valve is tricuspid.  Aortic valve regurgitation is not visualized. 6. Aortic dilatation noted. There is mild dilatation of the ascending aorta, measuring 39 mm. 7. The inferior vena cava is normal in size with greater than 50% respiratory variability, suggesting right atrial pressure of 3 mmHg.  FINDINGS Left Ventricle: Left ventricular ejection fraction, by estimation, is >75%. The left ventricle has hyperdynamic function. The left ventricle has no regional wall motion abnormalities. Definity contrast agent was given IV to delineate the left ventricular endocardial borders. The left ventricular internal cavity size was normal in size. There is mild left ventricular hypertrophy. Left ventricular diastolic parameters are consistent with Grade I diastolic dysfunction (impaired relaxation). Elevated left atrial pressure.  Right Ventricle: The right ventricular size is normal. Right vetricular wall thickness was not assessed. Right ventricular systolic  function is normal.  Left Atrium: Left atrial size was mildly dilated.  Right Atrium: Right atrial size was normal in size.  Pericardium: There is no evidence of pericardial effusion.  Mitral Valve: There is mild thickening of the mitral valve leaflet(s). Moderate mitral annular calcification. Trivial mitral valve regurgitation.  Tricuspid Valve: The tricuspid valve is normal in structure. Tricuspid valve regurgitation is mild.  Aortic Valve: AV is thickened, calcified with restricted motion Peak and mean gradients through the valve are 22 and 12 mm Hg respectively. Dimensionless index is 0.44. COnsistent with mild AS>. The aortic valve is tricuspid. Aortic valve regurgitation is not visualized. Aortic valve mean gradient measures 11.8 mmHg. Aortic valve peak gradient measures 20.7 mmHg. Aortic valve area, by VTI measures 2.00 cm.  Pulmonic Valve: The pulmonic valve was not well visualized. Pulmonic valve regurgitation is not visualized. No evidence of pulmonic  stenosis.  Aorta: The aortic root is normal in size and structure and aortic dilatation noted. There is mild dilatation of the ascending aorta, measuring 39 mm.  Venous: The inferior vena cava is normal in size with greater than 50% respiratory variability, suggesting right atrial pressure of 3 mmHg.  IAS/Shunts: No atrial level shunt detected by color flow Doppler.   LEFT VENTRICLE PLAX 2D LVIDd:         5.50 cm   Diastology LVIDs:         2.80 cm   LV e' medial:    4.79 cm/s LV PW:         0.80 cm   LV E/e' medial:  23.8 LV IVS:        1.30 cm   LV e' lateral:   5.11 cm/s LVOT diam:     2.40 cm   LV E/e' lateral: 22.3 LV SV:         104 LV SV Index:   46 LVOT Area:     4.52 cm   RIGHT VENTRICLE             IVC RV Basal diam:  3.60 cm     IVC diam: 1.80 cm RV Mid diam:    2.75 cm RV S prime:     12.80 cm/s TAPSE (M-mode): 3.3 cm  LEFT ATRIUM             Index        RIGHT ATRIUM           Index LA diam:        4.30 cm 1.90 cm/m   RA Area:     14.70 cm LA Vol (A2C):   67.4 ml 29.80 ml/m  RA Volume:   38.10 ml  16.84 ml/m LA Vol (A4C):   85.3 ml 37.71 ml/m LA Biplane Vol: 76.1 ml 33.65 ml/m AORTIC VALVE AV Area (Vmax):    2.05 cm AV Area (Vmean):   1.95 cm AV Area (VTI):     2.00 cm AV Vmax:           227.75 cm/s AV Vmean:          161.750 cm/s AV VTI:            0.520 m AV Peak Grad:      20.7 mmHg AV Mean Grad:      11.8 mmHg LVOT Vmax:         103.00 cm/s LVOT Vmean:        69.700 cm/s LVOT VTI:  0.230 m LVOT/AV VTI ratio: 0.44  AORTA Ao Root diam: 3.20 cm Ao Asc diam:  3.90 cm  MITRAL VALVE                TRICUSPID VALVE MV Area (PHT): 2.37 cm     TR Peak grad:   32.7 mmHg MV Decel Time: 320 msec     TR Vmax:        286.00 cm/s MV E velocity: 114.00 cm/s MV A velocity: 114.00 cm/s  SHUNTS MV E/A ratio:  1.00         Systemic VTI:  0.23 m Systemic Diam: 2.40 cm  Dorris Carnes MD Electronically signed by Dorris Carnes MD Signature Date/Time:  09/02/2021/3:19:18 PM    Final              EKG:  EKG is  ordered today.  The ekg ordered today demonstrates NSR 78 bpmw with first degree AV block PR 210 with occasional PAC, ALD, LVH and pulmonary disease program.  Recent Labs: No results found for requested labs within last 365 days.  Recent Lipid Panel No results found for: "CHOL", "TRIG", "HDL", "CHOLHDL", "VLDL", "LDLCALC", "LDLDIRECT"   Home Medications   Current Meds  Medication Sig   allopurinol (ZYLOPRIM) 100 MG tablet Take 200 mg by mouth daily.   metoprolol tartrate (LOPRESSOR) 50 MG tablet Take 50 mg by mouth 2 (two) times daily.   potassium gluconate 595 (99 K) MG TABS tablet TAKE 1 TABLET BY MOUTH EVERY DAY FOR 30 DAYS   torsemide (DEMADEX) 20 MG tablet Take 20 mg by mouth daily. Take 1 tablet daily and Take 2 tablets in the morning and 1 tablet in the evening every other day   UNABLE TO FIND Take 2 capsules by mouth daily. Med Name: Tart Peach   vitamin B-12 (CYANOCOBALAMIN) 500 MCG tablet Take 500 mcg by mouth daily.     Review of Systems      All other systems reviewed and are otherwise negative except as noted above.  Physical Exam    VS:  BP 134/82   Pulse 78   Ht 5\' 6"  (1.676 m)   Wt 268 lb (121.6 kg)   BMI 43.26 kg/m  , BMI Body mass index is 43.26 kg/m.  Wt Readings from Last 3 Encounters:  02/02/23 268 lb (121.6 kg)  04/20/22 262 lb 6.4 oz (119 kg)  04/15/22 257 lb (116.6 kg)    GEN: Well nourished, overweight, well developed, in no acute distress. HEENT: normal. Neck: Supple, no JVD, carotid bruits, or masses. Cardiac: RRR, no  rubs, or gallops. Gr 99991111 systolic murmur. No clubbing, cyanosis. Bilateral LE 1+ pitting edema.  Radials/PT 2+ and equal bilaterally.  Respiratory:  Respirations regular and unlabored, clear to auscultation bilaterally. GI: Soft, nontender, nondistended. MS: No deformity or atrophy. Skin: Warm and dry, no rash. Neuro:  Strength and sensation are intact. Psych:  Normal affect.  Assessment & Plan    Dyspnea on exertion-likely multifactorial obesity hypoventilation syndrome, chronic diastolic heart failure.  Plan to update CBC, BMP, echocardiogram.  Determine diuretic dosing pending lab results.  Educated on pursed lip breathing.  Mild aortic stenosis-noted by echo 09/02/2021 with normal LVEF, grade 1 diastolic dysfunction.  Update echocardiogram.  Chronic diastolic heart failure-Per previous documentation has previously declined escalation of guideline directed medical therapy. Volume status difficult to ascertain due to body habitus. Continue metoprolol tartrate 50 mg twice daily.  Continue torsemide 40 mg a.m. and 20 mg in the  afternoon.  Update BMP today to determine volume status and consider adjustment of dosing.  BMP earlier this month with PCP creatinine 0.9.  Update echo as above.  Consider addition of SGLT2i at follow-up.  Daytime somnolence -given body habitus, daytime somnolence concern for OSA.  No prior sleep study.  She is unaware if she snores if she sleeps alone.  She does endorse resistance to possible CPAP so we will continue to rediscuss at follow-up and not order sleep study at this time.        Disposition: Follow up in 1 month(s) with Kirk Ruths, MD or APP.  Signed, Loel Dubonnet, NP 02/02/2023, 4:26 PM [facility]

## 2023-02-02 NOTE — Patient Instructions (Addendum)
Medication Instructions:  Continue your current medications.  We will consider medication changes based on your lab work.   *If you need a refill on your cardiac medications before your next appointment, please call your pharmacy*   Lab Work: Your physician recommends that you return for lab work today: thyroid panel, CBC, BNP  If you have labs (blood work) drawn today and your tests are completely normal, you will receive your results only by: [place] (if you have MyChart) OR A paper copy in the mail If you have any lab test that is abnormal or we need to change your treatment, we will call you to review the results.   Testing/Procedures: Your EKG today showed normal sinus rhythm.   Your physician has requested that you have an echocardiogram. Echocardiography is a painless test that uses sound waves to create images of your heart. It provides your doctor with information about the size and shape of your heart and how well your heart's chambers and valves are working. This procedure takes approximately one hour. There are no restrictions for this procedure. Please do NOT wear cologne, perfume, aftershave, or lotions (deodorant is allowed). Please arrive 15 minutes prior to your appointment time.    Follow-Up: At [facility], you and your health needs are our priority.  As part of our continuing mission to provide you with exceptional heart care, we have created designated Provider Care Teams.  These Care Teams include your primary Cardiologist (physician) and Advanced Practice Providers (APPs -  Physician Assistants and Nurse Practitioners) who all work together to provide you with the care you need, when you need it.  We recommend signing up for the patient portal called "MyChart".  Sign up information is provided on this After Visit Summary.  MyChart is used to connect with patients for Virtual Visits (Telemedicine).  Patients are able to view lab/test results,  encounter notes, upcoming appointments, etc.  Non-urgent messages can be sent to your provider as well.   To learn more about what you can do with MyChart, go to [email or web address].    Your next appointment:   1 month(s)  Provider:   Kirk Ruths, MD    Other Instructions  To prevent or reduce lower extremity swelling: Eat a low salt diet. Salt makes the body hold onto extra fluid which causes swelling. Sit with legs elevated. For example, in the recliner or on an [place].  Wear knee-high compression stockings during the daytime. Ones labeled 15-20 mmHg provide good compression.   Pursed Lip Breathing Pursed lip breathing is a technique to relieve the feeling of being short of breath.  Being short of breath can make you tense and anxious. Before you start this breathing exercise, take a minute to relax your shoulders and close your eyes. Then: Start the exercise by closing your mouth. Breathe in through your nose, taking a normal breath. You can do this at your normal rate of breathing. If you feel you are not getting enough air, breathe in while slowly counting to 2 or 3. Pucker (purse) your lips as if you were going to whistle. Gently tighten the muscles of your abdomenor press on your abdomen to help push the air out. Breathe out slowly through your pursed lips. Take at least twice as long to breathe out as it takes you to breathe in. Make sure that you breathe out all of the air, but do not force air out.

## 2023-02-03 ENCOUNTER — Telehealth (HOSPITAL_BASED_OUTPATIENT_CLINIC_OR_DEPARTMENT_OTHER): Payer: Self-pay

## 2023-02-03 LAB — THYROID PANEL WITH TSH
Free Thyroxine Index: 2.4 (ref 1.2–4.9)
T3 Uptake Ratio: 28 % (ref 24–39)
T4, Total: 8.4 ug/dL (ref 4.5–12.0)
TSH: 3.17 u[IU]/mL (ref 0.450–4.500)

## 2023-02-03 LAB — CBC
Hematocrit: 39.6 % (ref 34.0–46.6)
Hemoglobin: 13.2 g/dL (ref 11.1–15.9)
MCH: 29.9 pg (ref 26.6–33.0)
MCHC: 33.3 g/dL (ref 31.5–35.7)
MCV: 90 fL (ref 79–97)
Platelets: 238 10*3/uL (ref 150–450)
RBC: 4.42 x10E6/uL (ref 3.77–5.28)
RDW: 13.8 % (ref 11.7–15.4)
WBC: 7.5 10*3/uL (ref 3.4–10.8)

## 2023-02-03 LAB — BRAIN NATRIURETIC PEPTIDE: BNP: 211.9 pg/mL — ABNORMAL HIGH (ref 0.0–100.0)

## 2023-02-03 NOTE — Telephone Encounter (Addendum)
Left message for patient to call back     ----- Message from Loel Dubonnet, NP sent at 02/03/2023  2:51 PM EDT ----- CBC with no evidence of anemia nor infection.  Normal thyroid function. BNP reveals volume overload.   Recommend adjust Torsemide to 40mg  twice daily for 3 days then return to 40mg  daily in the morning and 20mg  in the afternoon.

## 2023-02-06 ENCOUNTER — Ambulatory Visit (INDEPENDENT_AMBULATORY_CARE_PROVIDER_SITE_OTHER): Payer: PPO

## 2023-02-06 ENCOUNTER — Telehealth (HOSPITAL_BASED_OUTPATIENT_CLINIC_OR_DEPARTMENT_OTHER): Payer: Self-pay

## 2023-02-06 DIAGNOSIS — I350 Nonrheumatic aortic (valve) stenosis: Secondary | ICD-10-CM

## 2023-02-06 DIAGNOSIS — R0609 Other forms of dyspnea: Secondary | ICD-10-CM | POA: Diagnosis not present

## 2023-02-06 LAB — ECHOCARDIOGRAM COMPLETE
AR max vel: 0.95 cm2
AV Area VTI: 0.95 cm2
AV Area mean vel: 0.88 cm2
AV Mean grad: 15 mmHg
AV Peak grad: 26 mmHg
Ao pk vel: 2.55 m/s
Area-P 1/2: 3.27 cm2
Est EF: 55
MV M vel: 5.4 m/s
MV Peak grad: 116.6 mmHg
Radius: 0.6 cm
S' Lateral: 2.69 cm

## 2023-02-06 NOTE — Telephone Encounter (Addendum)
Results called to patient who verbalizes understanding! Patient states that something really needs to be done about her breathing. She states that she is having trouble doing anything. She states even after her echo today when she got back to her car she had to catch her breath for 20 minutes before she felt steady enough. Patient states she would like this sent to her providers, because something really has to be done!     ----- Message from Loel Dubonnet, NP sent at 02/06/2023  3:26 PM EDT ----- Echocardiogram with normal heart pumping function.  Moderate leaking of the mitral valve.  Moderate stiffening of the aortic valve.  Repeat echocardiogram in 1 year for monitoring.  Follow-up as scheduled.

## 2023-02-06 NOTE — Telephone Encounter (Signed)
We were going to discuss addition of SGLT2i Farxiga 10mg  daily at her follow up. This medication will help her heart to work more efficiently, prevent fluid retention. If she would like to start prior to her next office visit, please Rx Farxiga 10mg  QD with BMP/BNP one week after starting.  Loel Dubonnet, NP

## 2023-02-06 NOTE — Telephone Encounter (Signed)
Returned call to patient and provided the following recommendations, patient states that she would prefer time to research this medication first before having it prescribed. She will let us know if she changes her mind prior to her upcoming appointment.     "We were going to discuss addition of SGLT2i Farxiga 10mg  daily at her follow up. This medication will help her heart to work more efficiently, prevent fluid retention. If she would like to start prior to her next office visit, please Rx Farxiga 10mg  QD with BMP/BNP one week after starting.   Loel Dubonnet, NP"

## 2023-02-06 NOTE — Telephone Encounter (Signed)
Patient returning call, transferred from call center. Reviewed results with patient, she verbalizes understanding and would like a print out of her results when she comes for her echo today.      ----- Message from Loel Dubonnet, NP sent at 02/03/2023  2:51 PM EDT ----- CBC with no evidence of anemia nor infection.  Normal thyroid function. BNP reveals volume overload.    Recommend adjust Torsemide to 40mg  twice daily for 3 days then return to 40mg  daily in the morning and 20mg  in the afternoon.

## 2023-02-06 NOTE — Telephone Encounter (Signed)
Left message for patient to call back        ----- Message from Loel Dubonnet, NP sent at 02/03/2023  2:51 PM EDT ----- CBC with no evidence of anemia nor infection.  Normal thyroid function. BNP reveals volume overload.    Recommend adjust Torsemide to 40mg  twice daily for 3 days then return to 40mg  daily in the morning and 20mg  in the afternoon.

## 2023-02-07 DIAGNOSIS — R29898 Other symptoms and signs involving the musculoskeletal system: Secondary | ICD-10-CM | POA: Diagnosis not present

## 2023-02-07 DIAGNOSIS — Z789 Other specified health status: Secondary | ICD-10-CM | POA: Diagnosis not present

## 2023-02-07 DIAGNOSIS — M47812 Spondylosis without myelopathy or radiculopathy, cervical region: Secondary | ICD-10-CM | POA: Diagnosis not present

## 2023-02-07 DIAGNOSIS — Z7409 Other reduced mobility: Secondary | ICD-10-CM | POA: Diagnosis not present

## 2023-02-09 DIAGNOSIS — Z789 Other specified health status: Secondary | ICD-10-CM | POA: Diagnosis not present

## 2023-02-09 DIAGNOSIS — Z7409 Other reduced mobility: Secondary | ICD-10-CM | POA: Diagnosis not present

## 2023-02-09 DIAGNOSIS — M47812 Spondylosis without myelopathy or radiculopathy, cervical region: Secondary | ICD-10-CM | POA: Diagnosis not present

## 2023-02-09 DIAGNOSIS — R29898 Other symptoms and signs involving the musculoskeletal system: Secondary | ICD-10-CM | POA: Diagnosis not present

## 2023-02-17 DIAGNOSIS — M65321 Trigger finger, right index finger: Secondary | ICD-10-CM | POA: Diagnosis not present

## 2023-02-17 DIAGNOSIS — G5601 Carpal tunnel syndrome, right upper limb: Secondary | ICD-10-CM | POA: Diagnosis not present

## 2023-03-15 NOTE — Progress Notes (Signed)
HPI: FU CHF and tachycardia. By records she refused all medications.  Patient apparently seen in [place] July 2022 with dyspnea on exertion and lower extremity edema.  By report her blood pressure was 192/93. Nuclear study March 2023 showed ejection fraction 58% and no infarction or ischemia.  Echocardiogram April 2024 showed ejection fraction 55%, moderate left ventricular hypertrophy, mild left atrial enlargement, moderate mitral regurgitation, moderate aortic stenosis with mean gradient 15 mmHg, aortic valve area 1 cm.  Since last seen, patient has noticed increased dyspnea on exertion.  She also had bilateral lower extremity edema.  No orthopnea, PND, chest pain or syncope.  She has had some problems with fatigue.  Note she was not taking her diuretics as prescribed but has begun that in the past 2 weeks with some improvement.  Current Outpatient Medications  Medication Sig Dispense Refill   allopurinol (ZYLOPRIM) 100 MG tablet Take 200 mg by mouth daily.     metoprolol tartrate (LOPRESSOR) 50 MG tablet Take 50 mg by mouth 2 (two) times daily.     potassium gluconate 595 (99 K) MG TABS tablet TAKE 1 TABLET BY MOUTH EVERY DAY FOR 30 DAYS     torsemide (DEMADEX) 20 MG tablet Take 20 mg by mouth daily. Take 1 tablet daily and Take 2 tablets in the morning and 1 tablet in the evening every other day     UNABLE TO FIND Take 2 capsules by mouth daily. Med Name: Tart Peach     vitamin B-12 (CYANOCOBALAMIN) 500 MCG tablet Take 500 mcg by mouth daily.     No current facility-administered medications for this visit.     Past Medical History:  Diagnosis Date   Hypertension     Past Surgical History:  Procedure Laterality Date   DILATION AND CURETTAGE OF UTERUS     TOTAL KNEE ARTHROPLASTY Bilateral     Social History   Socioeconomic History   Marital status: Widowed    Spouse name: Not on file   Number of children: 3   Years of education: Not on file   Highest education level: Not  on file  Occupational History   Not on file  Tobacco Use   Smoking status: Never    Passive exposure: Past   Smokeless tobacco: Never  Vaping Use   Vaping Use: Never used  Substance and Sexual Activity   Alcohol use: Yes    Comment: rare   Drug use: Never   Sexual activity: Not on file  Other Topics Concern   Not on file  Social History Narrative   Not on file   Social Determinants of Health   Financial Resource Strain: Not on file  Food Insecurity: Not on file  Transportation Needs: Not on file  Physical Activity: Not on file  Stress: Not on file  Social Connections: Not on file  Intimate Partner Violence: Not on file    Family History  Problem Relation Age of Onset   Angina Mother     ROS: no fevers or chills, productive cough, hemoptysis, dysphasia, odynophagia, melena, hematochezia, dysuria, hematuria, rash, seizure activity, orthopnea, PND, pedal edema, claudication. Remaining systems are negative.  Physical Exam: Well-developed well-nourished in no acute distress.  Skin is warm and dry.  HEENT is normal.  Neck is supple.  Chest is clear to auscultation with normal expansion.  Cardiovascular exam is regular rate and rhythm.  2/6 systolic murmur left sternal border.  S2 is not diminished. Abdominal exam nontender or distended.  No masses palpated. Extremities show 1+ edema. neuro grossly intact   A/P  1 aortic stenosis-moderate on most recent echocardiogram.  Will plan follow-up echocardiogram April 2025.  She understands she likely require aortic valve replacement in the future.  2 chronic diastolic congestive heart failure-volume overloaded on exam.  However she was not taking her Demadex as prescribed.  She has had some improvement since reinitiating.  We will increase to 40 mg twice daily.  Add Farxiga 10 mg daily. Check potassium and renal function in 1 week.  3 hypertension-blood pressure mildly elevated.  We are increasing the diuretic.  Follow and  adjust as needed.  4 history of tachycardia-no symptoms.  Continue beta-blocker.  5 history of dyspnea-previous evaluation unrevealing.  This is felt to be multifactorial including obesity hypoventilation syndrome and possible contribution from sleep apnea as well as chronic diastolic congestive heart failure.  Olga Millers, MD

## 2023-03-23 ENCOUNTER — Encounter: Payer: Self-pay | Admitting: Cardiology

## 2023-03-23 ENCOUNTER — Ambulatory Visit: Payer: PPO | Attending: Cardiology | Admitting: Cardiology

## 2023-03-23 VITALS — BP 144/80 | HR 91 | Ht 66.0 in | Wt 266.8 lb

## 2023-03-23 DIAGNOSIS — Z6841 Body Mass Index (BMI) 40.0 and over, adult: Secondary | ICD-10-CM | POA: Diagnosis not present

## 2023-03-23 DIAGNOSIS — I10 Essential (primary) hypertension: Secondary | ICD-10-CM | POA: Diagnosis not present

## 2023-03-23 DIAGNOSIS — I130 Hypertensive heart and chronic kidney disease with heart failure and stage 1 through stage 4 chronic kidney disease, or unspecified chronic kidney disease: Secondary | ICD-10-CM | POA: Diagnosis not present

## 2023-03-23 DIAGNOSIS — N1831 Chronic kidney disease, stage 3a: Secondary | ICD-10-CM | POA: Diagnosis not present

## 2023-03-23 DIAGNOSIS — M1A00X0 Idiopathic chronic gout, unspecified site, without tophus (tophi): Secondary | ICD-10-CM | POA: Diagnosis not present

## 2023-03-23 DIAGNOSIS — I5032 Chronic diastolic (congestive) heart failure: Secondary | ICD-10-CM

## 2023-03-23 DIAGNOSIS — I700 Atherosclerosis of aorta: Secondary | ICD-10-CM | POA: Diagnosis not present

## 2023-03-23 DIAGNOSIS — I350 Nonrheumatic aortic (valve) stenosis: Secondary | ICD-10-CM

## 2023-03-23 DIAGNOSIS — R0609 Other forms of dyspnea: Secondary | ICD-10-CM

## 2023-03-23 DIAGNOSIS — Z0000 Encounter for general adult medical examination without abnormal findings: Secondary | ICD-10-CM | POA: Diagnosis not present

## 2023-03-23 MED ORDER — DAPAGLIFLOZIN PROPANEDIOL 10 MG PO TABS: 10.0000 mg | ORAL_TABLET | Freq: Every day | ORAL | 11 refills | Status: DC

## 2023-03-23 MED ORDER — TORSEMIDE 20 MG PO TABS: 40.0000 mg | ORAL_TABLET | Freq: Two times a day (BID) | ORAL | 3 refills | Status: DC

## 2023-03-23 NOTE — Patient Instructions (Signed)
Medication Instructions:   INCREASE TORSEMIDE TO 40 MG TWICE DAILY= 2 OF THE 20 MG TABLETS TWICE DAILY  START FARXIGA 10 MG ONCE DAILY  *If you need a refill on your cardiac medications before your next appointment, please call your pharmacy*   Lab Work:  Your physician recommends that you return for lab work in: ONE WEEK-DO NOT NEED TO FAST  If you have labs (blood work) drawn today and your tests are completely normal, you will receive your results only by: MyChart Message (if you have MyChart) OR A paper copy in the mail If you have any lab test that is abnormal or we need to change your treatment, we will call you to review the results.   Follow-Up: At [facility], you and your health needs are our priority.  As part of our continuing mission to provide you with exceptional heart care, we have created designated Provider Care Teams.  These Care Teams include your primary Cardiologist (physician) and Advanced Practice Providers (APPs -  Physician Assistants and Nurse Practitioners) who all work together to provide you with the care you need, when you need it.  We recommend signing up for the patient portal called "MyChart".  Sign up information is provided on this After Visit Summary.  MyChart is used to connect with patients for Virtual Visits (Telemedicine).  Patients are able to view lab/test results, encounter notes, upcoming appointments, etc.  Non-urgent messages can be sent to your provider as well.   To learn more about what you can do with MyChart, go to [email or web address].    Your next appointment:   6 week(s)  Provider:   ANY APP    Your physician wants you to follow-up in: 6 MONTHS WITH DR Jens Som  You will receive a reminder letter in the mail two months in advance. If you don't receive a letter, please call our office to schedule the follow-up appointment.

## 2023-03-30 DIAGNOSIS — R0609 Other forms of dyspnea: Secondary | ICD-10-CM | POA: Diagnosis not present

## 2023-03-30 DIAGNOSIS — I5032 Chronic diastolic (congestive) heart failure: Secondary | ICD-10-CM | POA: Diagnosis not present

## 2023-03-31 ENCOUNTER — Encounter: Payer: Self-pay | Admitting: *Deleted

## 2023-03-31 LAB — BASIC METABOLIC PANEL
BUN/Creatinine Ratio: 18 (ref 12–28)
BUN: 19 mg/dL (ref 8–27)
CO2: 23 mmol/L (ref 20–29)
Calcium: 9.7 mg/dL (ref 8.7–10.3)
Chloride: 103 mmol/L (ref 96–106)
Creatinine, Ser: 1.06 mg/dL — ABNORMAL HIGH (ref 0.57–1.00)
Glucose: 109 mg/dL — ABNORMAL HIGH (ref 70–99)
Potassium: 4.2 mmol/L (ref 3.5–5.2)
Sodium: 143 mmol/L (ref 134–144)
eGFR: 52 mL/min/{1.73_m2} — ABNORMAL LOW (ref >59)

## 2023-04-04 DIAGNOSIS — G5601 Carpal tunnel syndrome, right upper limb: Secondary | ICD-10-CM | POA: Diagnosis not present

## 2023-04-04 DIAGNOSIS — R94131 Abnormal electromyogram [EMG]: Secondary | ICD-10-CM | POA: Diagnosis not present

## 2023-04-04 DIAGNOSIS — M65321 Trigger finger, right index finger: Secondary | ICD-10-CM | POA: Diagnosis not present

## 2023-04-14 DIAGNOSIS — G5601 Carpal tunnel syndrome, right upper limb: Secondary | ICD-10-CM | POA: Diagnosis not present

## 2023-04-14 DIAGNOSIS — M65321 Trigger finger, right index finger: Secondary | ICD-10-CM | POA: Diagnosis not present

## 2023-04-24 DIAGNOSIS — N907 Vulvar cyst: Secondary | ICD-10-CM | POA: Diagnosis not present

## 2023-05-03 ENCOUNTER — Encounter: Payer: Self-pay | Admitting: Nurse Practitioner

## 2023-05-03 ENCOUNTER — Ambulatory Visit: Payer: PPO | Attending: Nurse Practitioner | Admitting: Nurse Practitioner

## 2023-05-03 VITALS — BP 134/76 | HR 80 | Ht 66.0 in | Wt 262.0 lb

## 2023-05-03 DIAGNOSIS — I350 Nonrheumatic aortic (valve) stenosis: Secondary | ICD-10-CM

## 2023-05-03 DIAGNOSIS — I5032 Chronic diastolic (congestive) heart failure: Secondary | ICD-10-CM | POA: Diagnosis not present

## 2023-05-03 DIAGNOSIS — I10 Essential (primary) hypertension: Secondary | ICD-10-CM | POA: Diagnosis not present

## 2023-05-03 DIAGNOSIS — Z87898 Personal history of other specified conditions: Secondary | ICD-10-CM

## 2023-05-03 DIAGNOSIS — R0609 Other forms of dyspnea: Secondary | ICD-10-CM

## 2023-05-03 NOTE — Progress Notes (Unsigned)
Office Visit    Patient Name: [name] Date of Encounter: 05/03/2023  Primary Care Provider:  Joycelyn Rua, MD Primary Cardiologist:  Olga Millers, MD  Chief Complaint    82 year old female with a history of moderate aortic stenosis, chronic diastolic heart failure, hypertension, tachycardia, and chronic dyspnea who presents for follow-up related to heart failure.   Past Medical History    Past Medical History:  Diagnosis Date   Hypertension    Past Surgical History:  Procedure Laterality Date   DILATION AND CURETTAGE OF UTERUS     TOTAL KNEE ARTHROPLASTY Bilateral     Allergies  Allergies  Allergen Reactions   Corticosteroids Anaphylaxis    Other Reaction(s): severe HTN   Gluten Meal Other (See Comments)    Pt. Stated,"gives me sinuses."   Methylprednisolone Sodium Succ Other (See Comments)     Labs/Other Studies Reviewed    The following studies were reviewed today:  Cardiac Studies & Procedures     STRESS TESTS  MYOCARDIAL PERFUSION IMAGING 01/28/2022  Narrative   The study is normal. Findings are consistent with no prior ischemia and no prior myocardial infarction. The study is low risk.   No ST deviation was noted.   LV perfusion is normal. There is no evidence of ischemia. There is no evidence of infarction.   Left ventricular function is normal. Nuclear stress EF: 58 %. The left ventricular ejection fraction is normal (55-65%). End diastolic cavity size is normal. End systolic cavity size is normal.   Prior study not available for comparison.   ECHOCARDIOGRAM  ECHOCARDIOGRAM COMPLETE 02/06/2023  Narrative ECHOCARDIOGRAM REPORT    Patient Name:   [name] Date of Exam: 02/06/2023 Medical Rec #:  [id number]        Height:       66.0 in Accession #:    [id number]       Weight:       268.0 lb Date of Birth:  Oct 20, 1941         BSA:          2.265 m Patient Age:    81 years         BP:           135/74 mmHg Patient Gender: F                 HR:           80 bpm. Exam Location:  Outpatient  Procedure: 2D Echo, 3D Echo, Color Doppler, Cardiac Doppler and Strain Analysis  Indications:    Aortic stenosis I35.0  History:        Patient has prior history of Echocardiogram examinations, most recent 09/02/2021. Arrythmias:Tachycardia; Risk Factors:Non-Smoker and Hypertension. Mild LVH; Patient reports for the last few weeks has noted more dyspnea with walking "on land". Exercises by walking an hour in the water 5 days per week and does not note dyspnea with this activity. However, notes when she is walking into the exercise facility will have dyspnea. Symptoms improve when she sits and rests.  Sonographer:    Jeryl Columbia RDCS Referring Phys: [id number] CAITLIN S WALKER  IMPRESSIONS   1. Abnormal septal motion. Left ventricular ejection fraction, by estimation, is 55%. The left ventricle has normal function. The left ventricle has no regional wall motion abnormalities. There is moderate left ventricular hypertrophy. Left ventricular diastolic parameters are indeterminate. 2. Right ventricular systolic function is normal. The right ventricular size is normal. 3. Left atrial size  was mildly dilated. 4. The mitral valve is abnormal. Moderate mitral valve regurgitation. No evidence of mitral stenosis. Severe mitral annular calcification. 5. Gradients slightly higher than prior TTE with LVOT diameter used on this study 2.0 rather than 2.4. DVI 0.30 AVA 1 cm2 with gradients suggest moderate AS . The aortic valve is tricuspid. There is moderate calcification of the aortic valve. There is moderate thickening of the aortic valve. Aortic valve regurgitation is not visualized. Moderate aortic valve stenosis. 6. The inferior vena cava is dilated in size with >50% respiratory variability, suggesting right atrial pressure of 8 mmHg.  FINDINGS Left Ventricle: Abnormal septal motion. Left ventricular ejection fraction, by estimation, is  55%. The left ventricle has normal function. The left ventricle has no regional wall motion abnormalities. The left ventricular internal cavity size was normal in size. There is moderate left ventricular hypertrophy. Left ventricular diastolic parameters are indeterminate.  Right Ventricle: The right ventricular size is normal. No increase in right ventricular wall thickness. Right ventricular systolic function is normal.  Left Atrium: Left atrial size was mildly dilated.  Right Atrium: Right atrial size was normal in size.  Pericardium: There is no evidence of pericardial effusion.  Mitral Valve: The mitral valve is abnormal. There is moderate thickening of the mitral valve leaflet(s). There is moderate calcification of the mitral valve leaflet(s). Severe mitral annular calcification. Moderate mitral valve regurgitation. No evidence of mitral valve stenosis.  Tricuspid Valve: The tricuspid valve is normal in structure. Tricuspid valve regurgitation is mild . No evidence of tricuspid stenosis.  Aortic Valve: Gradients slightly higher than prior TTE with LVOT diameter used on this study 2.0 rather than 2.4. DVI 0.30 AVA 1 cm2 with gradients suggest moderate AS. The aortic valve is tricuspid. There is moderate calcification of the aortic valve. There is moderate thickening of the aortic valve. Aortic valve regurgitation is not visualized. Moderate aortic stenosis is present. Aortic valve mean gradient measures 15.0 mmHg. Aortic valve peak gradient measures 26.0 mmHg. Aortic valve area, by VTI measures 0.95 cm.  Pulmonic Valve: The pulmonic valve was normal in structure. Pulmonic valve regurgitation is mild. No evidence of pulmonic stenosis.  Aorta: The aortic root is normal in size and structure.  Venous: The inferior vena cava is dilated in size with greater than 50% respiratory variability, suggesting right atrial pressure of 8 mmHg.  IAS/Shunts: No atrial level shunt detected by color  flow Doppler.   LEFT VENTRICLE PLAX 2D LVIDd:         4.52 cm   Diastology LVIDs:         2.69 cm   LV e' medial:    5.82 cm/s LV PW:         1.28 cm   LV E/e' medial:  20.8 LV IVS:        1.32 cm   LV e' lateral:   8.59 cm/s LVOT diam:     2.00 cm   LV E/e' lateral: 14.1 LV SV:         50 LV SV Index:   22        2D Longitudinal Strain LVOT Area:     3.14 cm  2D Strain GLS Avg:     -14.8 %  3D Volume EF: 3D EF:        68 % LV EDV:       135 ml LV ESV:       44 ml LV SV:  91 ml  RIGHT VENTRICLE RV Basal diam:  4.14 cm RV Mid diam:    3.81 cm RV S prime:     12.40 cm/s TAPSE (M-mode): 3.6 cm  LEFT ATRIUM             Index        RIGHT ATRIUM           Index LA diam:        3.80 cm 1.68 cm/m   RA Area:     11.30 cm LA Vol (A2C):   84.5 ml 37.31 ml/m  RA Volume:   28.20 ml  12.45 ml/m LA Vol (A4C):   58.1 ml 25.65 ml/m LA Biplane Vol: 71.7 ml 31.66 ml/m AORTIC VALVE AV Area (Vmax):    0.95 cm AV Area (Vmean):   0.88 cm AV Area (VTI):     0.95 cm AV Vmax:           255.00 cm/s AV Vmean:          177.000 cm/s AV VTI:            0.527 m AV Peak Grad:      26.0 mmHg AV Mean Grad:      15.0 mmHg LVOT Vmax:         77.00 cm/s LVOT Vmean:        49.300 cm/s LVOT VTI:          0.159 m LVOT/AV VTI ratio: 0.30  AORTA Ao Root diam: 2.80 cm Ao Asc diam:  3.20 cm  MITRAL VALVE                  TRICUSPID VALVE MV Area (PHT): 3.27 cm       TR Peak grad:   50.1 mmHg MV Decel Time: 232 msec       TR Vmax:        354.00 cm/s MR Peak grad:    116.6 mmHg MR Mean grad:    84.0 mmHg    SHUNTS MR Vmax:         540.00 cm/s  Systemic VTI:  0.16 m MR Vmean:        439.0 cm/s   Systemic Diam: 2.00 cm MR PISA:         2.26 cm MR PISA Eff ROA: 13 mm MR PISA Radius:  0.60 cm MV E velocity: 121.00 cm/s MV A velocity: 116.00 cm/s MV E/A ratio:  1.04  Charlton Haws MD Electronically signed by Charlton Haws MD Signature Date/Time: 02/06/2023/2:15:27 PM    Final             Recent Labs: 02/02/2023: BNP 211.9; Hemoglobin 13.2; Platelets 238; TSH 3.170 03/30/2023: BUN 19; Creatinine, Ser 1.06; Potassium 4.2; Sodium 143  Recent Lipid Panel No results found for: "CHOL", "TRIG", "HDL", "CHOLHDL", "VLDL", "LDLCALC", "LDLDIRECT"  History of Present Illness    82 year old female with with the above past medical history including moderate aortic stenosis, chronic diastolic heart failure, hypertension, tachycardia, and chronic dyspnea.  Prior records she has declined escalation of medical therapy.  She was evaluated in [place] in July 2022 in the setting of dyspnea on exertion, lower extremity edema.  BP was elevated by report.  Nuclear study in March 2023 showed EF 55-60%, no infarction or ischemia.  Echocardiogram in April 2024 showed EF 55%, moderate LVH, mild left atrial enlargement, moderate mitral valve regurgitation, moderate aortic stenosis, mean gradient 15 mmHg.  She was last seen in the office  on 03/23/2023 and noted increased dyspnea on exertion, fatigue, bilateral lower extremity edema.  She reported not taking her diuretics as prescribed.  Torsemide was increased to 40 mg twice daily.  She was started on Farxiga.  It was noted that her chronic dyspnea was likely multifactorial in the setting of obesity hypoventilation syndrome, possible sleep apnea and chronic diastolic heart failure.  She presents today for follow-up.  Since her last visit she has been stable overall from a cardiac standpoint.  She continues to note dyspnea on exertion, overall unchanged from prior visits.  She denies any chest pain.  She states she has not noticed any improvement in her dyspnea with increased torsemide dosing though she reports she has not been taking her torsemide regularly twice a day as she sometimes forgets to take it.  Other than her ongoing dyspnea on exertion, she reports feeling well.   Home Medications    Current Outpatient Medications  Medication Sig Dispense  Refill   allopurinol (ZYLOPRIM) 100 MG tablet Take 200 mg by mouth daily.     dapagliflozin propanediol (FARXIGA) 10 MG TABS tablet Take 1 tablet (10 mg total) by mouth daily before breakfast. 30 tablet 11   metoprolol tartrate (LOPRESSOR) 50 MG tablet Take 50 mg by mouth 2 (two) times daily.     potassium gluconate 595 (99 K) MG TABS tablet TAKE 1 TABLET BY MOUTH EVERY DAY FOR 30 DAYS     torsemide (DEMADEX) 20 MG tablet Take 2 tablets (40 mg total) by mouth 2 (two) times daily. 360 tablet 3   UNABLE TO FIND Take 2 capsules by mouth daily as needed (gout flare ups). Med Name: Tart Peach     vitamin B-12 (CYANOCOBALAMIN) 500 MCG tablet Take 500 mcg by mouth daily.     No current facility-administered medications for this visit.     Review of Systems    She denies chest pain, palpitations, pnd, orthopnea, n, v, dizziness, syncope, weight gain, or early satiety. All other systems reviewed and are otherwise negative except as noted above.   Physical Exam    VS:  BP 134/76   Pulse 80   Ht 5\' 6"  (1.676 m)   Wt 262 lb (118.8 kg)   SpO2 97%   BMI 42.29 kg/m  GEN: Well nourished, well developed, in no acute distress. HEENT: normal. Neck: Supple, no JVD, carotid bruits, or masses. Cardiac: RRR, 2/6 systolic murmur, no rubs, or gallops. No clubbing, cyanosis, 1+pitting edema.  Radials/DP/PT 2+ and equal bilaterally.  Respiratory:  Respirations regular and unlabored, clear to auscultation bilaterally. GI: Soft, nontender, nondistended, BS + x 4. MS: no deformity or atrophy. Skin: warm and dry, no rash. Neuro:  Strength and sensation are intact. Psych: Normal affect.  Accessory Clinical Findings    ECG personally reviewed by me today -   no EKG in office today.    Lab Results  Component Value Date   WBC 7.5 02/02/2023   HGB 13.2 02/02/2023   HCT 39.6 02/02/2023   MCV 90 02/02/2023   PLT 238 02/02/2023   Lab Results  Component Value Date   CREATININE 1.06 (H) 03/30/2023   BUN 19  03/30/2023   NA 143 03/30/2023   K 4.2 03/30/2023   CL 103 03/30/2023   CO2 23 03/30/2023   Lab Results  Component Value Date   ALT 21 01/20/2009   AST 22 01/20/2009   ALKPHOS 104 01/20/2009   BILITOT 0.8 01/20/2009   No results found for: "  CHOL", "HDL", "LDLCALC", "LDLDIRECT", "TRIG", "CHOLHDL"  No results found for: "HGBA1C"  Assessment & Plan    1. Chronic diastolic heart failure: Most recent echo in April 2024 showed EF 55%, moderate LVH, mild left atrial enlargement, moderate mitral valve regurgitation, moderate aortic stenosis, mean gradient 15 mmHg. She has 1+ pitting bilateral lower extremity edema, left greater than right. She denies any PND, orthopnea, weight gain.  She has not been taking her torsemide twice daily as prescribed.  Encouraged adherence to twice daily dosing.   Will check BMET today.  Discussed sodium and fluid recommendations, ongoing monitoring with daily weights.  Continue metoprolol, torsemide, Farxiga.  2. Aortic stenosis: Moderate on most recent echo as above.  She does note dyspnea on exertion, lower extremity edema.  Will likely repeat echo in 02/2024.  She understands she will likely require aortic valve replacement in the future.  3. Hypertension: BP well controlled. Continue current antihypertensive regimen.   4. History of tachycardia: Denies any recent symptoms.  Stable on metoprolol.  5. Dyspnea on exertion: Previously thought to be multifactorial in the setting of obesity hypoventilation syndrome, possible sleep apnea, and chronic diastolic heart failure.  Most recent echo stable as above. Nuclear study in 01/2022 reassuring.  Her dyspnea has not improved with increased Lasix dosing though she has had intermittent adherence.  Patient states she does not think she has sleep apnea.  She is frustrated and concerned that she continues to experience significant dyspnea on exertion.  I will reach out to Dr. Jens Som to discuss any possible recommendations for  further evaluation.   6. Preoperative cardiac exam: She notes she is having carpal tunnel surgery in July 2024.  However, this will be under local anesthesia.  Low risk, she is okay to proceed.  7. Disposition: Follow-up scheduled with Dr. Jens Som in 09/2023, sooner if needed.     Joylene Grapes, NP 05/03/2023, 2:13 PM

## 2023-05-03 NOTE — Patient Instructions (Signed)
Medication Instructions:  Your physician recommends that you continue on your current medications as directed. Please refer to the Current Medication list given to you today.  *If you need a refill on your cardiac medications before your next appointment, please call your pharmacy*   Lab Work: BMET today   Testing/Procedures: NONE ordered at this time of appointment     Follow-Up: At [facility], you and your health needs are our priority.  As part of our continuing mission to provide you with exceptional heart care, we have created designated Provider Care Teams.  These Care Teams include your primary Cardiologist (physician) and Advanced Practice Providers (APPs -  Physician Assistants and Nurse Practitioners) who all work together to provide you with the care you need, when you need it.  We recommend signing up for the patient portal called "MyChart".  Sign up information is provided on this After Visit Summary.  MyChart is used to connect with patients for Virtual Visits (Telemedicine).  Patients are able to view lab/test results, encounter notes, upcoming appointments, etc.  Non-urgent messages can be sent to your provider as well.   To learn more about what you can do with MyChart, go to [email or web address].    Your next appointment:    Keep follow up   Provider:   Olga Millers, MD     Other Instructions

## 2023-05-04 ENCOUNTER — Encounter: Payer: Self-pay | Admitting: Nurse Practitioner

## 2023-05-04 LAB — BASIC METABOLIC PANEL
BUN/Creatinine Ratio: 16 (ref 12–28)
BUN: 18 mg/dL (ref 8–27)
CO2: 24 mmol/L (ref 20–29)
Calcium: 11.2 mg/dL — ABNORMAL HIGH (ref 8.7–10.3)
Chloride: 104 mmol/L (ref 96–106)
Creatinine, Ser: 1.14 mg/dL — ABNORMAL HIGH (ref 0.57–1.00)
Glucose: 112 mg/dL — ABNORMAL HIGH (ref 70–99)
Potassium: 4.4 mmol/L (ref 3.5–5.2)
Sodium: 139 mmol/L (ref 134–144)
eGFR: 48 mL/min/{1.73_m2} — ABNORMAL LOW (ref >59)

## 2023-05-05 ENCOUNTER — Telehealth: Payer: Self-pay

## 2023-05-05 NOTE — Telephone Encounter (Signed)
Left a detailed message on machine wit lab results. Pt advised to call back with any questions or concerns.

## 2023-05-30 DIAGNOSIS — G5601 Carpal tunnel syndrome, right upper limb: Secondary | ICD-10-CM | POA: Diagnosis not present

## 2023-05-30 DIAGNOSIS — M65321 Trigger finger, right index finger: Secondary | ICD-10-CM | POA: Diagnosis not present

## 2023-07-04 DIAGNOSIS — Z1231 Encounter for screening mammogram for malignant neoplasm of breast: Secondary | ICD-10-CM | POA: Diagnosis not present

## 2023-07-28 DIAGNOSIS — N39498 Other specified urinary incontinence: Secondary | ICD-10-CM | POA: Diagnosis not present

## 2023-07-28 DIAGNOSIS — E790 Hyperuricemia without signs of inflammatory arthritis and tophaceous disease: Secondary | ICD-10-CM | POA: Diagnosis not present

## 2023-07-28 DIAGNOSIS — Z6841 Body Mass Index (BMI) 40.0 and over, adult: Secondary | ICD-10-CM | POA: Diagnosis not present

## 2023-07-28 DIAGNOSIS — I5032 Chronic diastolic (congestive) heart failure: Secondary | ICD-10-CM | POA: Diagnosis not present

## 2023-08-07 ENCOUNTER — Telehealth: Payer: Self-pay | Admitting: Cardiology

## 2023-08-07 NOTE — Telephone Encounter (Signed)
Follow up scheduled

## 2023-08-07 NOTE — Telephone Encounter (Signed)
Spoke with patient and stated her she saw her PCP and had labs done. He changed her diagnosis from moderate aortic stenosis to severe aortic stenosis. She states he recommended following up before November. She stated since her last visit her SOB is getting worse.   She states she gets extremely SOB just getting up from the recliner. No chest pain but does swelling in her legs and ankles. Denies any weight gain. Appointment scheduled with Irving Burton on 10/4

## 2023-08-07 NOTE — Telephone Encounter (Signed)
Patient is requesting call back to discuss a procedure her PCP is requesting she have and to discuss with Dr. Jens Som in regards to.

## 2023-08-11 ENCOUNTER — Other Ambulatory Visit: Payer: Self-pay

## 2023-08-11 ENCOUNTER — Ambulatory Visit: Payer: PPO | Attending: Cardiology | Admitting: Nurse Practitioner

## 2023-08-11 ENCOUNTER — Encounter: Payer: Self-pay | Admitting: Nurse Practitioner

## 2023-08-11 VITALS — BP 128/68 | HR 81 | Ht 65.0 in | Wt 263.0 lb

## 2023-08-11 DIAGNOSIS — I350 Nonrheumatic aortic (valve) stenosis: Secondary | ICD-10-CM

## 2023-08-11 DIAGNOSIS — I5032 Chronic diastolic (congestive) heart failure: Secondary | ICD-10-CM

## 2023-08-11 DIAGNOSIS — R0609 Other forms of dyspnea: Secondary | ICD-10-CM

## 2023-08-11 DIAGNOSIS — Z87898 Personal history of other specified conditions: Secondary | ICD-10-CM | POA: Diagnosis not present

## 2023-08-11 DIAGNOSIS — I10 Essential (primary) hypertension: Secondary | ICD-10-CM

## 2023-08-11 NOTE — Patient Instructions (Signed)
Medication Instructions:  Resume Torsemide 40 mg daily   *If you need a refill on your cardiac medications before your next appointment, please call your pharmacy*   Lab Work: BMET today and BMET in 1-2 weeks.  Testing/Procedures: Your physician has requested that you have an echocardiogram. Echocardiography is a painless test that uses sound waves to create images of your heart. It provides your doctor with information about the size and shape of your heart and how well your heart's chambers and valves are working. This procedure takes approximately one hour. There are no restrictions for this procedure. Please do NOT wear cologne, perfume, aftershave, or lotions (deodorant is allowed). Please arrive 15 minutes prior to your appointment time.    Follow-Up: At [facility], you and your health needs are our priority.  As part of our continuing mission to provide you with exceptional heart care, we have created designated Provider Care Teams.  These Care Teams include your primary Cardiologist (physician) and Advanced Practice Providers (APPs -  Physician Assistants and Nurse Practitioners) who all work together to provide you with the care you need, when you need it.  We recommend signing up for the patient portal called "MyChart".  Sign up information is provided on this After Visit Summary.  MyChart is used to connect with patients for Virtual Visits (Telemedicine).  Patients are able to view lab/test results, encounter notes, upcoming appointments, etc.  Non-urgent messages can be sent to your provider as well.   To learn more about what you can do with MyChart, go to [email or web address].    Your next appointment:    Urgent apt with   Provider:   Olga Millers, MD

## 2023-08-11 NOTE — Progress Notes (Unsigned)
Office Visit    Patient Name: [name] Date of Encounter: 08/11/2023  Primary Care Provider:  Joycelyn Rua, MD Primary Cardiologist:  Olga Millers, MD  Chief Complaint    82 year old female with a history of moderate aortic stenosis, chronic diastolic heart failure, hypertension, tachycardia, and chronic dyspnea who presents for follow-up related to heart failure.    Past Medical History    Past Medical History:  Diagnosis Date   Hypertension    Past Surgical History:  Procedure Laterality Date   DILATION AND CURETTAGE OF UTERUS     TOTAL KNEE ARTHROPLASTY Bilateral     Allergies  Allergies  Allergen Reactions   Corticosteroids Anaphylaxis    Other Reaction(s): severe HTN   Gluten Meal Other (See Comments)    Pt. Stated,"gives me sinuses."   Methylprednisolone Sodium Succ Other (See Comments)     Labs/Other Studies Reviewed    The following studies were reviewed today:  Cardiac Studies & Procedures     STRESS TESTS  MYOCARDIAL PERFUSION IMAGING 01/28/2022  Narrative   The study is normal. Findings are consistent with no prior ischemia and no prior myocardial infarction. The study is low risk.   No ST deviation was noted.   LV perfusion is normal. There is no evidence of ischemia. There is no evidence of infarction.   Left ventricular function is normal. Nuclear stress EF: 58 %. The left ventricular ejection fraction is normal (55-65%). End diastolic cavity size is normal. End systolic cavity size is normal.   Prior study not available for comparison.   ECHOCARDIOGRAM  ECHOCARDIOGRAM COMPLETE 02/06/2023  Narrative ECHOCARDIOGRAM REPORT    Patient Name:   [name] Date of Exam: 02/06/2023 Medical Rec #:  [id number]        Height:       66.0 in Accession #:    [id number]       Weight:       268.0 lb Date of Birth:  1941-03-30         BSA:          2.265 m Patient Age:    81 years         BP:           135/74 mmHg Patient Gender: F                 HR:           80 bpm. Exam Location:  Outpatient  Procedure: 2D Echo, 3D Echo, Color Doppler, Cardiac Doppler and Strain Analysis  Indications:    Aortic stenosis I35.0  History:        Patient has prior history of Echocardiogram examinations, most recent 09/02/2021. Arrythmias:Tachycardia; Risk Factors:Non-Smoker and Hypertension. Mild LVH; Patient reports for the last few weeks has noted more dyspnea with walking "on land". Exercises by walking an hour in the water 5 days per week and does not note dyspnea with this activity. However, notes when she is walking into the exercise facility will have dyspnea. Symptoms improve when she sits and rests.  Sonographer:    Jeryl Columbia RDCS Referring Phys: [id number] CAITLIN S WALKER  IMPRESSIONS   1. Abnormal septal motion. Left ventricular ejection fraction, by estimation, is 55%. The left ventricle has normal function. The left ventricle has no regional wall motion abnormalities. There is moderate left ventricular hypertrophy. Left ventricular diastolic parameters are indeterminate. 2. Right ventricular systolic function is normal. The right ventricular size is normal. 3. Left atrial  size was mildly dilated. 4. The mitral valve is abnormal. Moderate mitral valve regurgitation. No evidence of mitral stenosis. Severe mitral annular calcification. 5. Gradients slightly higher than prior TTE with LVOT diameter used on this study 2.0 rather than 2.4. DVI 0.30 AVA 1 cm2 with gradients suggest moderate AS . The aortic valve is tricuspid. There is moderate calcification of the aortic valve. There is moderate thickening of the aortic valve. Aortic valve regurgitation is not visualized. Moderate aortic valve stenosis. 6. The inferior vena cava is dilated in size with >50% respiratory variability, suggesting right atrial pressure of 8 mmHg.  FINDINGS Left Ventricle: Abnormal septal motion. Left ventricular ejection fraction, by estimation,  is 55%. The left ventricle has normal function. The left ventricle has no regional wall motion abnormalities. The left ventricular internal cavity size was normal in size. There is moderate left ventricular hypertrophy. Left ventricular diastolic parameters are indeterminate.  Right Ventricle: The right ventricular size is normal. No increase in right ventricular wall thickness. Right ventricular systolic function is normal.  Left Atrium: Left atrial size was mildly dilated.  Right Atrium: Right atrial size was normal in size.  Pericardium: There is no evidence of pericardial effusion.  Mitral Valve: The mitral valve is abnormal. There is moderate thickening of the mitral valve leaflet(s). There is moderate calcification of the mitral valve leaflet(s). Severe mitral annular calcification. Moderate mitral valve regurgitation. No evidence of mitral valve stenosis.  Tricuspid Valve: The tricuspid valve is normal in structure. Tricuspid valve regurgitation is mild . No evidence of tricuspid stenosis.  Aortic Valve: Gradients slightly higher than prior TTE with LVOT diameter used on this study 2.0 rather than 2.4. DVI 0.30 AVA 1 cm2 with gradients suggest moderate AS. The aortic valve is tricuspid. There is moderate calcification of the aortic valve. There is moderate thickening of the aortic valve. Aortic valve regurgitation is not visualized. Moderate aortic stenosis is present. Aortic valve mean gradient measures 15.0 mmHg. Aortic valve peak gradient measures 26.0 mmHg. Aortic valve area, by VTI measures 0.95 cm.  Pulmonic Valve: The pulmonic valve was normal in structure. Pulmonic valve regurgitation is mild. No evidence of pulmonic stenosis.  Aorta: The aortic root is normal in size and structure.  Venous: The inferior vena cava is dilated in size with greater than 50% respiratory variability, suggesting right atrial pressure of 8 mmHg.  IAS/Shunts: No atrial level shunt detected by color  flow Doppler.   LEFT VENTRICLE PLAX 2D LVIDd:         4.52 cm   Diastology LVIDs:         2.69 cm   LV e' medial:    5.82 cm/s LV PW:         1.28 cm   LV E/e' medial:  20.8 LV IVS:        1.32 cm   LV e' lateral:   8.59 cm/s LVOT diam:     2.00 cm   LV E/e' lateral: 14.1 LV SV:         50 LV SV Index:   22        2D Longitudinal Strain LVOT Area:     3.14 cm  2D Strain GLS Avg:     -14.8 %  3D Volume EF: 3D EF:        68 % LV EDV:       135 ml LV ESV:       44 ml LV SV:  91 ml  RIGHT VENTRICLE RV Basal diam:  4.14 cm RV Mid diam:    3.81 cm RV S prime:     12.40 cm/s TAPSE (M-mode): 3.6 cm  LEFT ATRIUM             Index        RIGHT ATRIUM           Index LA diam:        3.80 cm 1.68 cm/m   RA Area:     11.30 cm LA Vol (A2C):   84.5 ml 37.31 ml/m  RA Volume:   28.20 ml  12.45 ml/m LA Vol (A4C):   58.1 ml 25.65 ml/m LA Biplane Vol: 71.7 ml 31.66 ml/m AORTIC VALVE AV Area (Vmax):    0.95 cm AV Area (Vmean):   0.88 cm AV Area (VTI):     0.95 cm AV Vmax:           255.00 cm/s AV Vmean:          177.000 cm/s AV VTI:            0.527 m AV Peak Grad:      26.0 mmHg AV Mean Grad:      15.0 mmHg LVOT Vmax:         77.00 cm/s LVOT Vmean:        49.300 cm/s LVOT VTI:          0.159 m LVOT/AV VTI ratio: 0.30  AORTA Ao Root diam: 2.80 cm Ao Asc diam:  3.20 cm  MITRAL VALVE                  TRICUSPID VALVE MV Area (PHT): 3.27 cm       TR Peak grad:   50.1 mmHg MV Decel Time: 232 msec       TR Vmax:        354.00 cm/s MR Peak grad:    116.6 mmHg MR Mean grad:    84.0 mmHg    SHUNTS MR Vmax:         540.00 cm/s  Systemic VTI:  0.16 m MR Vmean:        439.0 cm/s   Systemic Diam: 2.00 cm MR PISA:         2.26 cm MR PISA Eff ROA: 13 mm MR PISA Radius:  0.60 cm MV E velocity: 121.00 cm/s MV A velocity: 116.00 cm/s MV E/A ratio:  1.04  Charlton Haws MD Electronically signed by Charlton Haws MD Signature Date/Time: 02/06/2023/2:15:27 PM    Final             Recent Labs: 02/02/2023: BNP 211.9; Hemoglobin 13.2; Platelets 238; TSH 3.170 05/03/2023: BUN 18; Creatinine, Ser 1.14; Potassium 4.4; Sodium 139  Recent Lipid Panel No results found for: "CHOL", "TRIG", "HDL", "CHOLHDL", "VLDL", "LDLCALC", "LDLDIRECT"  History of Present Illness    82 year old female with with the above past medical history including moderate aortic stenosis, chronic diastolic heart failure, hypertension, tachycardia, and chronic dyspnea.   Prior records she has declined escalation of medical therapy.  She was evaluated in [place] in July 2022 in the setting of dyspnea on exertion, lower extremity edema.  BP was elevated by report.  Nuclear study in March 2023 showed EF 55-60%, no infarction or ischemia.  Echocardiogram in April 2024 showed EF 55%, moderate LVH, mild left atrial enlargement, moderate mitral valve regurgitation, moderate aortic stenosis, mean gradient 15 mmHg.  At her follow-up visit in May  2024 she noted increased dyspnea on exertion, fatigue, bilateral lower extremity edema.  She reported not taking her diuretics as prescribed.  Torsemide was increased to 40 mg twice daily.  She was started on Farxiga.  It was noted that her chronic dyspnea was likely multifactorial in the setting of obesity hypoventilation syndrome, possible sleep apnea and chronic diastolic heart failure. She was last seen in the office on 05/03/2023 and noted ongoing dyspnea on exertion, overall unchanged from prior visits.  She was not taking her torsemide regularly.  She contacted our office on 08/07/2023 and noted that her primary care doctor had changed her diagnosis from moderate aortic stenosis to severe aortic stenosis and recommended follow-up with cardiology sooner than November.   She presents today for follow-up.  Since her last visit she has been noticing markedly increased dyspnea both at rest and with exertion.  Her primary care doctor transitioned her from torsemide to  spironolactone.  She noted that she was urinating too frequently with the torsemide.  She denies chest pain, she does have 1+ pitting bilateral lower extremity edema, left greater than right.  Denies palpitations, dizziness, she did have a presyncopal episode while shopping at [facility] the other week.  She has noted increased fatigue.  Will check BMET today given new spironolactone, will repeat in 1 to 2 weeks.  Resume torsemide 40 mg daily.  Encouraged adherence.  Will repeat echocardiogram.  For evaluation of aortic stenosis.  Follow-up next available with Dr. Jens Som.  1. Chronic diastolic heart failure: Most recent echo in April 2024 showed EF 55%, moderate LVH, mild left atrial enlargement, moderate mitral valve regurgitation, moderate aortic stenosis, mean gradient 15 mmHg. She has 1+ pitting bilateral lower extremity edema, left greater than right. She denies any PND, orthopnea, weight gain.  She has not been taking her torsemide twice daily as prescribed.  Encouraged adherence to twice daily dosing.   Will check BMET today.  Discussed sodium and fluid recommendations, ongoing monitoring with daily weights.  Continue metoprolol, torsemide, Farxiga.   2. Aortic stenosis: Moderate on most recent echo as above.  She does note dyspnea on exertion, lower extremity edema.  Will likely repeat echo in 02/2024.  She understands she will likely require aortic valve replacement in the future.   3. Hypertension: BP well controlled. Continue current antihypertensive regimen.    4. History of tachycardia: Denies any recent symptoms.  Stable on metoprolol.   5. Dyspnea on exertion: Previously thought to be multifactorial in the setting of obesity hypoventilation syndrome, possible sleep apnea, and chronic diastolic heart failure.  Most recent echo stable as above. Nuclear study in 01/2022 reassuring.  Her dyspnea has not improved with increased Lasix dosing though she has had intermittent adherence.  Patient states she  does not think she has sleep apnea.  She is frustrated and concerned that she continues to experience significant dyspnea on exertion.  I will reach out to Dr. Jens Som to discuss any possible recommendations for further evaluation.    6. Disposition: Follow-up scheduled with Dr. Jens Som in 09/2023  Home Medications    Current Outpatient Medications  Medication Sig Dispense Refill   allopurinol (ZYLOPRIM) 100 MG tablet Take 200 mg by mouth daily.     dapagliflozin propanediol (FARXIGA) 10 MG TABS tablet Take 1 tablet (10 mg total) by mouth daily before breakfast. 30 tablet 11   metoprolol tartrate (LOPRESSOR) 50 MG tablet Take 50 mg by mouth 2 (two) times daily.     potassium gluconate 595 (  99 K) MG TABS tablet TAKE 1 TABLET BY MOUTH EVERY DAY FOR 30 DAYS     spironolactone (ALDACTONE) 25 MG tablet Take 25 mg by mouth daily.     UNABLE TO FIND Take 2 capsules by mouth daily as needed (gout flare ups). Med Name: Tart Peach     vitamin B-12 (CYANOCOBALAMIN) 500 MCG tablet Take 500 mcg by mouth daily.     torsemide (DEMADEX) 20 MG tablet Take 2 tablets (40 mg total) by mouth 2 (two) times daily. (Patient not taking: Reported on 08/11/2023) 360 tablet 3   No current facility-administered medications for this visit.     Review of Systems    ***.  All other systems reviewed and are otherwise negative except as noted above.    Physical Exam    VS:  BP 128/68 (BP Location: Left Arm, Patient Position: Sitting, Cuff Size: Large)   Pulse 81   Ht 5\' 5"  (1.651 m)   Wt 263 lb (119.3 kg)   BMI 43.77 kg/m  GEN: Well nourished, well developed, in no acute distress. HEENT: normal. Neck: Supple, no JVD, carotid bruits, or masses. Cardiac: RRR, no murmurs, rubs, or gallops. No clubbing, cyanosis, edema.  Radials/DP/PT 2+ and equal bilaterally.  Respiratory:  Respirations regular and unlabored, clear to auscultation bilaterally. GI: Soft, nontender, nondistended, BS + x 4. MS: no deformity or  atrophy. Skin: warm and dry, no rash. Neuro:  Strength and sensation are intact. Psych: Normal affect.  Accessory Clinical Findings    ECG personally reviewed by me today - EKG Interpretation Date/Time:  Friday August 11 2023 14:38:03 EDT Ventricular Rate:  81 PR Interval:  260 QRS Duration:  100 QT Interval:  366 QTC Calculation: 425 R Axis:   -49  Text Interpretation: Sinus rhythm with 1st degree A-V block Incomplete right bundle branch block Left anterior fascicular block Left ventricular hypertrophy ( R in aVL , Cornell product , Romhilt-Estes ) When compared with ECG of 02-Feb-2011 09:52, PR interval has increased Confirmed by Bernadene Person ([id number]) on 08/11/2023 3:10:27 PM  - no acute changes.   Lab Results  Component Value Date   WBC 7.5 02/02/2023   HGB 13.2 02/02/2023   HCT 39.6 02/02/2023   MCV 90 02/02/2023   PLT 238 02/02/2023   Lab Results  Component Value Date   CREATININE 1.14 (H) 05/03/2023   BUN 18 05/03/2023   NA 139 05/03/2023   K 4.4 05/03/2023   CL 104 05/03/2023   CO2 24 05/03/2023   Lab Results  Component Value Date   ALT 21 01/20/2009   AST 22 01/20/2009   ALKPHOS 104 01/20/2009   BILITOT 0.8 01/20/2009   No results found for: "CHOL", "HDL", "LDLCALC", "LDLDIRECT", "TRIG", "CHOLHDL"  No results found for: "HGBA1C"  Assessment & Plan    1.  ***      Joylene Grapes, NP 08/11/2023, 3:10 PM

## 2023-08-12 ENCOUNTER — Encounter: Payer: Self-pay | Admitting: Nurse Practitioner

## 2023-08-12 LAB — BASIC METABOLIC PANEL
BUN/Creatinine Ratio: 13 (ref 12–28)
BUN: 13 mg/dL (ref 8–27)
CO2: 21 mmol/L (ref 20–29)
Calcium: 10.2 mg/dL (ref 8.7–10.3)
Chloride: 106 mmol/L (ref 96–106)
Creatinine, Ser: 0.97 mg/dL (ref 0.57–1.00)
Glucose: 90 mg/dL (ref 70–99)
Potassium: 4.8 mmol/L (ref 3.5–5.2)
Sodium: 141 mmol/L (ref 134–144)
eGFR: 58 mL/min/{1.73_m2} — ABNORMAL LOW (ref >59)

## 2023-08-15 ENCOUNTER — Ambulatory Visit (HOSPITAL_COMMUNITY): Payer: PPO | Attending: Nurse Practitioner

## 2023-08-15 DIAGNOSIS — I350 Nonrheumatic aortic (valve) stenosis: Secondary | ICD-10-CM | POA: Insufficient documentation

## 2023-08-15 LAB — ECHOCARDIOGRAM COMPLETE
AR max vel: 1.22 cm2
AV Area VTI: 1.2 cm2
AV Area mean vel: 1.1 cm2
AV Mean grad: 16.2 mm[Hg]
AV Peak grad: 25.1 mm[Hg]
Ao pk vel: 2.51 m/s
Area-P 1/2: 3.53 cm2
MV VTI: 3.03 cm2
S' Lateral: 2.5 cm

## 2023-08-16 ENCOUNTER — Telehealth: Payer: Self-pay

## 2023-08-16 NOTE — Telephone Encounter (Signed)
Lmom to discuss echo results. Waiting on a return call. 

## 2023-08-21 NOTE — Progress Notes (Signed)
HPI: FU CHF and tachycardia. By records she has refused all medications.  Patient apparently seen in [place] July 2022 with dyspnea on exertion and lower extremity edema.  By report her blood pressure was 192/93. Nuclear study March 2023 showed ejection fraction 58% and no infarction or ischemia.  Echocardiogram October 2024 showed normal LV function, moderate left ventricular hypertrophy, grade 1 diastolic dysfunction, mild to moderate left atrial enlargement, calcified aortic valve with moderate aortic stenosis (mean gradient 16.2 mmHg with aortic valve area 1.2 cm), mildly dilated ascending aorta at 41 mm.  Since last seen, she describes increased dyspnea on exertion.  There is no orthopnea or PND.  She has chronic pedal edema left greater than right which she states is unchanged.  She has had occasional dizziness with ambulation.  She has not had syncope.  She denies chest pain.  Current Outpatient Medications  Medication Sig Dispense Refill   allopurinol (ZYLOPRIM) 100 MG tablet Take 200 mg by mouth daily.     dapagliflozin propanediol (FARXIGA) 10 MG TABS tablet Take 1 tablet (10 mg total) by mouth daily before breakfast. 30 tablet 11   metoprolol tartrate (LOPRESSOR) 50 MG tablet Take 50 mg by mouth 2 (two) times daily.     potassium gluconate 595 (99 K) MG TABS tablet TAKE 1 TABLET BY MOUTH EVERY DAY FOR 30 DAYS     spironolactone (ALDACTONE) 25 MG tablet Take 25 mg by mouth daily.     torsemide (DEMADEX) 20 MG tablet Take 2 tablets (40 mg total) by mouth 2 (two) times daily. (Patient taking differently: Take 40 mg by mouth daily.) 360 tablet 3   UNABLE TO FIND Take 2 capsules by mouth daily as needed (gout flare ups). Med Name: Tart Peach     vitamin B-12 (CYANOCOBALAMIN) 500 MCG tablet Take 500 mcg by mouth daily.     No current facility-administered medications for this visit.     Past Medical History:  Diagnosis Date   Hypertension     Past Surgical History:  Procedure  Laterality Date   DILATION AND CURETTAGE OF UTERUS     TOTAL KNEE ARTHROPLASTY Bilateral     Social History   Socioeconomic History   Marital status: Widowed    Spouse name: Not on file   Number of children: 3   Years of education: Not on file   Highest education level: Not on file  Occupational History   Not on file  Tobacco Use   Smoking status: Never    Passive exposure: Past   Smokeless tobacco: Never  Vaping Use   Vaping status: Never Used  Substance and Sexual Activity   Alcohol use: Yes    Comment: rare   Drug use: Never   Sexual activity: Not on file  Other Topics Concern   Not on file  Social History Narrative   Not on file   Social Determinants of Health   Financial Resource Strain: Not on file  Food Insecurity: Not on file  Transportation Needs: Not on file  Physical Activity: Not on file  Stress: Not on file  Social Connections: Not on file  Intimate Partner Violence: Not on file    Family History  Problem Relation Age of Onset   Angina Mother     ROS: no fevers or chills, productive cough, hemoptysis, dysphasia, odynophagia, melena, hematochezia, dysuria, hematuria, rash, seizure activity, orthopnea, PND, pedal edema, claudication. Remaining systems are negative.  Physical Exam: Well-developed obese in no acute distress.  Skin is warm and dry.  HEENT is normal.  Neck is supple.  Chest is clear to auscultation with normal expansion.  Cardiovascular exam is regular rate and rhythm.  2/6 systolic murmur left sternal border.  S2 is not diminished. Abdominal exam nontender or distended. No masses palpated. Extremities show 1+ edema. neuro grossly intact   A/P  1 chronic diastolic congestive heart failure-patient is complaining of increased dyspnea on exertion.  Her recent echocardiogram shows moderate aortic stenosis and does not appear to be significant enough to be causing her symptoms.  I will increase torsemide to 40 mg in the morning and 20  mg in the afternoon.  Continue spironolactone and Farxiga.  Check potassium and renal function as well as hemoglobin and BNP in 1 week.  2 aortic stenosis-moderate on most recent echocardiogram; mean gradient 16 mmHg, aortic valve area 1.2 cm and dimensionless index 0.38; valve is calcified with restricted motion but there is some movement and does not appear to be severe visually.  She does note some dyspnea on exertion which is worse compared to previous.  Not clear to me that this is all secondary to aortic stenosis.  She understands she will likely require aortic valve replacement in the future.  3 hypertension-blood pressure controlled.  Continue present medical regimen.  4 dyspnea-This is felt to be multifactorial including obesity hypoventilation syndrome with possible contribution from sleep apnea and diastolic congestive heart failure.  Will arrange sleep study.  I am increasing diuretics as outlined.  I will have her seen back in approximately 8 weeks with APP.  She will then follow-up with me in 4 months and [place].  5 history of tachycardia-continue beta-blocker.  Olga Millers, MD

## 2023-08-23 ENCOUNTER — Telehealth: Payer: Self-pay

## 2023-08-23 NOTE — Telephone Encounter (Signed)
Attempted to contact pts daughter to discuss echo results. Will call pt back to discuss results.

## 2023-08-24 NOTE — Telephone Encounter (Signed)
Lmom to discuss echo results. Waiting on a return call.

## 2023-09-04 ENCOUNTER — Ambulatory Visit: Payer: PPO | Attending: Cardiology | Admitting: Cardiology

## 2023-09-04 ENCOUNTER — Encounter: Payer: Self-pay | Admitting: Cardiology

## 2023-09-04 VITALS — BP 122/78 | HR 77 | Ht 65.0 in | Wt 261.6 lb

## 2023-09-04 DIAGNOSIS — I350 Nonrheumatic aortic (valve) stenosis: Secondary | ICD-10-CM

## 2023-09-04 DIAGNOSIS — I5032 Chronic diastolic (congestive) heart failure: Secondary | ICD-10-CM | POA: Diagnosis not present

## 2023-09-04 DIAGNOSIS — I10 Essential (primary) hypertension: Secondary | ICD-10-CM

## 2023-09-04 DIAGNOSIS — R0683 Snoring: Secondary | ICD-10-CM

## 2023-09-04 DIAGNOSIS — R0609 Other forms of dyspnea: Secondary | ICD-10-CM | POA: Diagnosis not present

## 2023-09-04 MED ORDER — TORSEMIDE 20 MG PO TABS: ORAL_TABLET | ORAL | 3 refills | Status: DC

## 2023-09-04 NOTE — Patient Instructions (Signed)
Medication Instructions:  Take torsemide 40 mg every am and 20 mg every afternoon. *If you need a refill on your cardiac medications before your next appointment, please call your pharmacy*   Lab Work: BMET, BNP, CBC in one week. If you have labs (blood work) drawn today and your tests are completely normal, you will receive your results only by: MyChart Message (if you have MyChart) OR A paper copy in the mail If you have any lab test that is abnormal or we need to change your treatment, we will call you to review the results.     Follow-Up: At [facility], you and your health needs are our priority.  As part of our continuing mission to provide you with exceptional heart care, we have created designated Provider Care Teams.  These Care Teams include your primary Cardiologist (physician) and Advanced Practice Providers (APPs -  Physician Assistants and Nurse Practitioners) who all work together to provide you with the care you need, when you need it.  We recommend signing up for the patient portal called "MyChart".  Sign up information is provided on this After Visit Summary.  MyChart is used to connect with patients for Virtual Visits (Telemedicine).  Patients are able to view lab/test results, encounter notes, upcoming appointments, etc.  Non-urgent messages can be sent to your provider as well.   To learn more about what you can do with MyChart, go to [email or web address].    Your next appointment:   8 week(s)  Provider:   Any APP    Then, Kellie Millers, MD will plan to see you again in 4 month(s).  In [place] office.

## 2023-09-08 NOTE — Telephone Encounter (Signed)
Mailed echo results to pt

## 2023-09-11 DIAGNOSIS — I5032 Chronic diastolic (congestive) heart failure: Secondary | ICD-10-CM | POA: Diagnosis not present

## 2023-09-12 LAB — BASIC METABOLIC PANEL
BUN/Creatinine Ratio: 21 (ref 12–28)
BUN: 28 mg/dL — ABNORMAL HIGH (ref 8–27)
CO2: 23 mmol/L (ref 20–29)
Calcium: 9.8 mg/dL (ref 8.7–10.3)
Chloride: 100 mmol/L (ref 96–106)
Creatinine, Ser: 1.36 mg/dL — ABNORMAL HIGH (ref 0.57–1.00)
Glucose: 93 mg/dL (ref 70–99)
Potassium: 4 mmol/L (ref 3.5–5.2)
Sodium: 140 mmol/L (ref 134–144)
eGFR: 39 mL/min/{1.73_m2} — ABNORMAL LOW (ref >59)

## 2023-09-12 LAB — CBC
Hematocrit: 47.2 % — ABNORMAL HIGH (ref 34.0–46.6)
Hemoglobin: 15.5 g/dL (ref 11.1–15.9)
MCH: 29.9 pg (ref 26.6–33.0)
MCHC: 32.8 g/dL (ref 31.5–35.7)
MCV: 91 fL (ref 79–97)
Platelets: 258 10*3/uL (ref 150–450)
RBC: 5.18 x10E6/uL (ref 3.77–5.28)
RDW: 14.3 % (ref 11.7–15.4)
WBC: 9.1 10*3/uL (ref 3.4–10.8)

## 2023-09-12 LAB — BRAIN NATRIURETIC PEPTIDE: BNP: 99.5 pg/mL (ref 0.0–100.0)

## 2023-09-14 ENCOUNTER — Encounter: Payer: Self-pay | Admitting: *Deleted

## 2023-09-29 ENCOUNTER — Ambulatory Visit: Payer: PPO | Admitting: Cardiology

## 2023-10-16 DIAGNOSIS — N39498 Other specified urinary incontinence: Secondary | ICD-10-CM | POA: Diagnosis not present

## 2023-10-16 DIAGNOSIS — I77810 Thoracic aortic ectasia: Secondary | ICD-10-CM | POA: Diagnosis not present

## 2023-10-16 DIAGNOSIS — I130 Hypertensive heart and chronic kidney disease with heart failure and stage 1 through stage 4 chronic kidney disease, or unspecified chronic kidney disease: Secondary | ICD-10-CM | POA: Diagnosis not present

## 2023-10-16 DIAGNOSIS — N1831 Chronic kidney disease, stage 3a: Secondary | ICD-10-CM | POA: Diagnosis not present

## 2023-10-16 DIAGNOSIS — Z6841 Body Mass Index (BMI) 40.0 and over, adult: Secondary | ICD-10-CM | POA: Diagnosis not present

## 2023-10-16 DIAGNOSIS — I5032 Chronic diastolic (congestive) heart failure: Secondary | ICD-10-CM | POA: Diagnosis not present

## 2023-10-16 DIAGNOSIS — E790 Hyperuricemia without signs of inflammatory arthritis and tophaceous disease: Secondary | ICD-10-CM | POA: Diagnosis not present

## 2023-11-06 ENCOUNTER — Encounter: Payer: Self-pay | Admitting: Cardiology

## 2023-11-10 DIAGNOSIS — N3946 Mixed incontinence: Secondary | ICD-10-CM | POA: Diagnosis not present

## 2023-11-15 ENCOUNTER — Ambulatory Visit: Payer: PPO | Admitting: Student

## 2023-11-26 NOTE — Progress Notes (Unsigned)
Cardiology Office Note    Date:  11/29/2023  ID:  Kellie, Rodriguez 14-Jul-1941, MRN [id number] PCP:  Kellie Rua, MD  Cardiologist:  Kellie Millers, MD  Electrophysiologist:  None   Chief Complaint: Follow up for shortness of breath   History of Present Illness: [name]    [name] is a 83 y.o. female with visit-pertinent history of moderate aortic stenosis, chronic diastolic heart failure, hypertension, tachycardia and chronic dyspnea.  In July 2022 she was evaluated in [place] in the setting of dyspnea on exertion, lower extremity edema.  By report her blood pressure was elevated.  Nuclear study in March 2023 showed EF 55 to 60%, no infarction or ischemia.  Echocardiogram in April 2024 showed EF 55%, moderate LVH, mild left atrial enlargement, moderate mitral valve regurgitation, moderate aortic stenosis, mean gradient 15 mmHg.  In May 2024 she noted increased dyspnea on exertion, fatigue, bilateral lower extremity edema.  She reported not taking her diuretics as prescribed.  Torsemide was increased to 40 mg twice daily.  She was started on Farxiga.  It was noted that her chronic dyspnea was likely multifactorial in setting of obesity, hypoventilation syndrome, possible sleep apnea and chronic diastolic heart failure.  At office visit in 05/03/2023 she reported ongoing dyspnea on exertion, overall unchanged from prior visits.  She was not taking her torsemide regularly.  On 08/07/2023 she contacted the office noting that her primary care doctor had changed her diagnosis from "moderate aortic stenosis to severe aortic stenosis and recommended follow-up with cardiology".  At visit on 08/11/2023 she reported markedly increased dyspnea at rest and with exertion.  Her PCP had transitioned her from torsemide to spironolactone.  She reported that she was urinating too frequently with torsemide.  She noted a presyncopal episode while shopping at [facility] the week prior to her visit.  Echocardiogram on  08/15/2023 indicated LVEF of 65 to 70%, no RWMA, moderate concentric LVH, grade 1 diastolic dysfunction, left atrial size was mild to moderately dilated, there is severe calcification of the aortic valve, regurgitation not visualized, moderate aortic valve stenosis was noted. She was seen in clinic by Dr. Jens Rodriguez on 09/04/2023 he increased her torsemide to 40 mg in the morning and 20 mg in the afternoon.  Patient was referred to pulmonology as he did not feel that her shortness of breath was secondary to her degree of aortic stenosis.  Today she presents for follow-up.  She reports that she has continued to have increased shortness of breath as well as lower extremity edema.  She notes concern regarding her renal function as her EGFR has been downtrending this year.  She also reports that she has been following with a urologist for problems with incontinence, she notes she was recently diagnosed with a UTI.  She notes with ongoing problems with incontinence she typically does not take an afternoon dose of torsemide, as when she takes torsemide she has very brisk diuresis that will last for hours.  She reports that she did not receive a call from pulmonology however she has obtained an Apple Watch and notes that it has not shown any evidence of sleep apnea. She continues doing water aerobics a few times a week.   ROS: .   Today she denies chest pain, palpitations, melena, hematuria, hemoptysis, diaphoresis, weakness, presyncope, syncope, orthopnea, and PND.  All other systems are reviewed and otherwise negative. Studies Reviewed: [name]    EKG:  EKG is not ordered today.   CV Studies:  Cardiac Studies & Procedures     STRESS TESTS  MYOCARDIAL PERFUSION IMAGING 01/28/2022  Narrative   The study is normal. Findings are consistent with no prior ischemia and no prior myocardial infarction. The study is low risk.   No ST deviation was noted.   LV perfusion is normal. There is no evidence of ischemia. There  is no evidence of infarction.   Left ventricular function is normal. Nuclear stress EF: 58 %. The left ventricular ejection fraction is normal (55-65%). End diastolic cavity size is normal. End systolic cavity size is normal.   Prior study not available for comparison.  ECHOCARDIOGRAM  ECHOCARDIOGRAM COMPLETE 08/15/2023  Narrative ECHOCARDIOGRAM REPORT    Patient Name:   [name] Date of Exam: 08/15/2023 Medical Rec #:  [id number]        Height:       65.0 in Accession #:    [id number]       Weight:       263.0 lb Date of Birth:  [birth year]         BSA:          2.222 m Patient Age:    82 years         BP:           128/68 mmHg Patient Gender: F                HR:           89 bpm. Exam Location:  [address]  Procedure: 2D Echo, Cardiac Doppler and Color Doppler  Indications:    I35.0 Aortic Stenosis  History:        Patient has prior history of Echocardiogram examinations, most recent 02/06/2023. Risk Factors:Hypertension.  Sonographer:    [name] RDCS Referring Phys: [id number] [name]  IMPRESSIONS   1. Left ventricular ejection fraction, by estimation, is 65 to 70%. The left ventricle has normal function. The left ventricle has no regional wall motion abnormalities. There is moderate concentric left ventricular hypertrophy. Left ventricular diastolic parameters are consistent with Grade I diastolic dysfunction (impaired relaxation). 2. Right ventricular systolic function is normal. The right ventricular size is normal. 3. Left atrial size was mild to moderately dilated. 4. The mitral valve is normal in structure. Trivial mitral valve regurgitation. No evidence of mitral stenosis. Moderate mitral annular calcification. 5. The aortic valve is tricuspid. There is severe calcifcation of the aortic valve. Aortic valve regurgitation is not visualized. Moderate aortic valve stenosis. Aortic valve area, by VTI measures 1.20 cm. Aortic valve mean gradient  measures 16.2 mmHg. Aortic valve Vmax measures 2.51 m/s. 6. Aortic dilatation noted. There is mild dilatation of the ascending aorta, measuring 41 mm. 7. The inferior vena cava is normal in size with greater than 50% respiratory variability, suggesting right atrial pressure of 3 mmHg.  FINDINGS Left Ventricle: Left ventricular ejection fraction, by estimation, is 65 to 70%. The left ventricle has normal function. The left ventricle has no regional wall motion abnormalities. The left ventricular internal cavity size was normal in size. There is moderate concentric left ventricular hypertrophy. Left ventricular diastolic parameters are consistent with Grade I diastolic dysfunction (impaired relaxation).  Right Ventricle: The right ventricular size is normal. No increase in right ventricular wall thickness. Right ventricular systolic function is normal.  Left Atrium: Left atrial size was mild to moderately dilated.  Right Atrium: Right atrial size was normal in size.  Pericardium: There is no evidence of pericardial effusion.  The mitral valve is normal in structure. Moderate mitral annular calcification. Trivial mitral valve regurgitation. No evidence of mitral valve stenosis. Tricuspid Valve: The tricuspid valve is normal in structure. Tricuspid valve regurgitation is mild . No evidence of tricuspid stenosis.  The aortic valve is tricuspid. There is severe calcifcation of the aortic valve. Aortic valve regurgitation is not visualized. Moderate aortic stenosis is present. Pulmonic Valve: The pulmonic valve was normal in structure. Pulmonic valve regurgitation is not visualized. No evidence of pulmonic stenosis.  Aorta: Aortic dilatation noted. There is mild dilatation of the ascending aorta, measuring 41 mm.  Venous: The inferior vena cava is normal in size with greater than 50% respiratory variability, suggesting right atrial pressure of 3 mmHg.  IAS/Shunts: No atrial level shunt detected  by color flow Doppler.   LEFT VENTRICLE PLAX 2D LVIDd:         4.10 cm   Diastology LVIDs:         2.50 cm   LV e' medial:    4.46 cm/s LV PW:         1.50 cm   LV E/e' medial:  14.1 LV IVS:        1.50 cm   LV e' lateral:   7.94 cm/s LVOT diam:     2.00 cm   LV E/e' lateral: 7.9 LV SV:         62 LV SV Index:   28 LVOT Area:     3.14 cm   RIGHT VENTRICLE             IVC RV Basal diam:  3.30 cm     IVC diam: 1.80 cm RV S prime:     14.20 cm/s TAPSE (M-mode): 2.5 cm  LEFT ATRIUM             Index        RIGHT ATRIUM          Index LA diam:        5.00 cm 2.25 cm/m   RA Area:     8.45 cm LA Vol (A2C):   66.4 ml 29.88 ml/m  RA Volume:   16.10 ml 7.24 ml/m LA Vol (A4C):   63.5 ml 28.57 ml/m LA Biplane Vol: 66.1 ml 29.74 ml/m AORTIC VALVE AV Area (Vmax):    1.22 cm AV Area (Vmean):   1.10 cm AV Area (VTI):     1.20 cm AV Vmax:           250.60 cm/s AV Vmean:          190.800 cm/s AV VTI:            0.512 m AV Peak Grad:      25.1 mmHg AV Mean Grad:      16.2 mmHg LVOT Vmax:         97.50 cm/s LVOT Vmean:        66.900 cm/s LVOT VTI:          0.196 m LVOT/AV VTI ratio: 0.38  AORTA Ao Root diam: 3.40 cm Ao Asc diam:  4.10 cm  MITRAL VALVE                TRICUSPID VALVE MV Area (PHT): 3.53 cm     TR Peak grad:   24.8 mmHg MV Area VTI:   3.03 cm     TR Vmax:        249.00 cm/s MV Peak grad:  4.7 mmHg MV Mean grad:  2.0 mmHg     SHUNTS MV Vmax:       1.08 m/s     Systemic VTI:  0.20 m MV Vmean:      73.6 cm/s    Systemic Diam: 2.00 cm MV Decel Time: 215 msec MV E velocity: 62.90 cm/s MV A velocity: 103.50 cm/s MV E/A ratio:  0.61  Arvilla Meres MD Electronically signed by Arvilla Meres MD Signature Date/Time: 08/15/2023/12:10:10 PM    Final             Current Reported Medications:.    Current Meds  Medication Sig   allopurinol (ZYLOPRIM) 100 MG tablet Take 200 mg by mouth daily.   dapagliflozin propanediol (FARXIGA) 10 MG TABS tablet Take 1  tablet (10 mg total) by mouth daily before breakfast.   metoprolol tartrate (LOPRESSOR) 50 MG tablet Take 50 mg by mouth 2 (two) times daily.   potassium gluconate 595 (99 K) MG TABS tablet TAKE 1 TABLET BY MOUTH EVERY DAY FOR 30 DAYS   spironolactone (ALDACTONE) 25 MG tablet Take 25 mg by mouth daily.   torsemide (DEMADEX) 20 MG tablet Increase dose to 40 mg in the morning and keep dose at 20 mg in the afternoon.   UNABLE TO FIND Take 2 capsules by mouth daily as needed (gout flare ups). Med Name: TART CHERRY   vitamin B-12 (CYANOCOBALAMIN) 500 MCG tablet Take 500 mcg by mouth daily.   Physical Exam:    VS:  BP 129/76 (BP Location: Left Arm, Patient Position: Sitting, Cuff Size: Large)   Pulse 78   Ht 5\' 7"  (1.702 m)   Wt 263 lb 3.2 oz (119.4 kg)   SpO2 99%   BMI 41.22 kg/m    Wt Readings from Last 3 Encounters:  11/29/23 263 lb 3.2 oz (119.4 kg)  09/04/23 261 lb 9.6 oz (118.7 kg)  08/11/23 263 lb (119.3 kg)    GEN: Well nourished, well developed in no acute distress NECK: No JVD; No carotid bruits CARDIAC: RRR, 3/6 systolic murmur, no rubs or gallops RESPIRATORY:  Clear to auscultation without rales, wheezing or rhonchi  ABDOMEN: Soft, non-tender, non-distended EXTREMITIES:  1+ bilateral lower extremity edema; No acute deformity   Asessement and Plan:.    Chronic diastolic heart failure/DOE: Echo in April 2024 showed EF 55%, moderate LVH, mild left atrial management, moderate mitral valve regurgitation, moderate aortic stenosis, mean gradient 15 mmHg.  Shortness of breath previously felt to be multifactorial in setting of obesity, hypoventilation syndrome, possible sleep apnea and chronic diastolic heart failure.  Repeat echo on 08/15/2023 indicated LVEF of 65 to 70%, no RWMA, moderate concentric LVH, grade 1 diastolic dysfunction.  Today she reports ongoing dyspnea on exertion, notes that she frequently does not take her afternoon doses of torsemide due to problems with incontinence.   She does not feel that her torsemide has made a significant difference in her breathing.  Discussed Dr. Ludwig Clarks prior recommendations regarding referral to pulmonology, patient agreeable please see below.  Encouraged patient to continue seeing her urologist and to continue torsemide.  Discussed compression stockings, she notes that she is unable to put them on.  Discussed leg elevation and decrease salt intake.  Continue torsemide, Farxiga, metoprolol, spironolactone. Check BMET and BNP today.   Aortic stenosis: Noted to be moderate on echo on 08/15/2023.  Mean gradient 16 mmHg, aortic valve area 1.2 cm in dimensions index 0.38, valve is calcified restricted motion but there is some movement and does not appear to  be severe visually per Dr. Jens Rodriguez. He previously noted that he did not feel that her shortness of breath was all secondary to aortic stenosis.  Hypertension: Blood pressure today 129/76. Continue current antihypertensive regimen.  OSA?:  Dr. Jens Rodriguez previously referred back to pulmonology for possible sleep apnea.  Patient notes that she has obtained an Apple Watch that has not indicated that she has episodes of sleep apnea.  After further discussion she is agreeable to pulmonology referral, she was assisted with scheduling a visit prior to leaving the office today.  History of tachycardia: Denies any recent symptoms.  Continue metoprolol.  Decline in renal function?:  Patient notes concern for decline in renal function this year.  She notes that her EGFR on 08/11/2023 was 58.  On 09/11/2023 it was 39.  Discussed that this could be related to possible dehydration the day her labs were drawn.  Will check BMET today, patient requests referral to nephrology if this remains decreased.     Disposition: F/u with Dr. Jens Rodriguez in March as scheduled.   Signed, Rip Harbour, NP

## 2023-11-29 ENCOUNTER — Ambulatory Visit: Payer: PPO | Attending: Cardiology | Admitting: Cardiology

## 2023-11-29 ENCOUNTER — Encounter: Payer: Self-pay | Admitting: Cardiology

## 2023-11-29 VITALS — BP 129/76 | HR 78 | Ht 67.0 in | Wt 263.2 lb

## 2023-11-29 DIAGNOSIS — I5032 Chronic diastolic (congestive) heart failure: Secondary | ICD-10-CM | POA: Diagnosis not present

## 2023-11-29 DIAGNOSIS — Z87898 Personal history of other specified conditions: Secondary | ICD-10-CM | POA: Diagnosis not present

## 2023-11-29 DIAGNOSIS — I10 Essential (primary) hypertension: Secondary | ICD-10-CM | POA: Diagnosis not present

## 2023-11-29 DIAGNOSIS — I350 Nonrheumatic aortic (valve) stenosis: Secondary | ICD-10-CM

## 2023-11-29 DIAGNOSIS — R0609 Other forms of dyspnea: Secondary | ICD-10-CM

## 2023-11-29 NOTE — Patient Instructions (Addendum)
Medication Instructions:  No changes *If you need a refill on your cardiac medications before your next appointment, please call your pharmacy*  Lab Work: Today we are going to get a Bmet and BNP If you have labs (blood work) drawn today and your tests are completely normal, you will receive your results only by: MyChart Message (if you have MyChart) OR A paper copy in the mail If you have any lab test that is abnormal or we need to change your treatment, we will call you to review the results.  Testing/Procedures: No testing  Follow-Up: At [facility], you and your health needs are our priority.  As part of our continuing mission to provide you with exceptional heart care, we have created designated Provider Care Teams.  These Care Teams include your primary Cardiologist (physician) and Advanced Practice Providers (APPs -  Physician Assistants and Nurse Practitioners) who all work together to provide you with the care you need, when you need it.  We recommend signing up for the patient portal called "MyChart".  Sign up information is provided on this After Visit Summary.  MyChart is used to connect with patients for Virtual Visits (Telemedicine).  Patients are able to view lab/test results, encounter notes, upcoming appointments, etc.  Non-urgent messages can be sent to your provider as well.   To learn more about what you can do with MyChart, go to [email or web address].    Your next appointment:   Already Scheduled   Other Instructions

## 2023-11-30 LAB — BASIC METABOLIC PANEL
BUN/Creatinine Ratio: 14 (ref 12–28)
BUN: 17 mg/dL (ref 8–27)
CO2: 23 mmol/L (ref 20–29)
Calcium: 10.6 mg/dL — ABNORMAL HIGH (ref 8.7–10.3)
Chloride: 100 mmol/L (ref 96–106)
Creatinine, Ser: 1.24 mg/dL — ABNORMAL HIGH (ref 0.57–1.00)
Glucose: 110 mg/dL — ABNORMAL HIGH (ref 70–99)
Potassium: 4.9 mmol/L (ref 3.5–5.2)
Sodium: 140 mmol/L (ref 134–144)
eGFR: 43 mL/min/{1.73_m2} — ABNORMAL LOW (ref >59)

## 2023-11-30 LAB — BRAIN NATRIURETIC PEPTIDE: BNP: 71.5 pg/mL (ref 0.0–100.0)

## 2023-12-01 ENCOUNTER — Telehealth (HOSPITAL_BASED_OUTPATIENT_CLINIC_OR_DEPARTMENT_OTHER): Payer: Self-pay

## 2023-12-01 DIAGNOSIS — R944 Abnormal results of kidney function studies: Secondary | ICD-10-CM

## 2023-12-01 NOTE — Telephone Encounter (Signed)
Spoke with pt. Pt was notified of results. Pt voiced understanding and would like to move forward with nephrology referral. Referral placed and records will be faxed Monday (fax [phone]).

## 2023-12-14 DIAGNOSIS — R350 Frequency of micturition: Secondary | ICD-10-CM | POA: Diagnosis not present

## 2024-01-10 ENCOUNTER — Institutional Professional Consult (permissible substitution): Payer: PPO | Admitting: Primary Care

## 2024-01-12 NOTE — Progress Notes (Signed)
 HPI: FU CHF and tachycardia. By records she has refused all medications.  Patient apparently seen in [place] July 2022 with dyspnea on exertion and lower extremity edema.  By report her blood pressure was 192/93. Nuclear study March 2023 showed ejection fraction 58% and no infarction or ischemia.  Echocardiogram October 2024 showed normal LV function, moderate left ventricular hypertrophy, grade 1 diastolic dysfunction, mild to moderate left atrial enlargement, calcified aortic valve with moderate aortic stenosis (mean gradient 16.2 mmHg with aortic valve area 1.2 cm), mildly dilated ascending aorta at 41 mm.  Since last seen, she has some dyspnea on exertion unchanged.  No orthopnea or PND.  Chronic pedal edema.  No chest pain or syncope.  She stopped taking her diuretics 1-1/2 weeks ago as she felt it was causing kidney issues.  Current Outpatient Medications  Medication Sig Dispense Refill   allopurinol (ZYLOPRIM) 100 MG tablet Take 200 mg by mouth daily.     Coenzyme Q10 (CO Q-10) 200 MG CAPS Take by mouth.     dapagliflozin propanediol (FARXIGA) 10 MG TABS tablet Take 1 tablet (10 mg total) by mouth daily before breakfast. 30 tablet 11   Magnesium 300 MG CAPS 1 capsule with a meal Orally Once a day     metoprolol tartrate (LOPRESSOR) 50 MG tablet Take 50 mg by mouth 2 (two) times daily.     potassium gluconate 595 (99 K) MG TABS tablet TAKE 1 TABLET BY MOUTH EVERY DAY FOR 30 DAYS     spironolactone (ALDACTONE) 25 MG tablet Take 25 mg by mouth daily.     Turmeric (QC TUMERIC COMPLEX PO) Take by mouth.     UNABLE TO FIND Take 2 capsules by mouth daily as needed (gout flare ups). Med Name: TART CHERRY     vitamin B-12 (CYANOCOBALAMIN) 500 MCG tablet Take 500 mcg by mouth daily.     torsemide (DEMADEX) 20 MG tablet Increase dose to 40 mg in the morning and keep dose at 20 mg in the afternoon. (Patient not taking: Reported on 01/24/2024) 270 tablet 3   No current facility-administered  medications for this visit.     Past Medical History:  Diagnosis Date   Hypertension     Past Surgical History:  Procedure Laterality Date   DILATION AND CURETTAGE OF UTERUS     TOTAL KNEE ARTHROPLASTY Bilateral     Social History   Socioeconomic History   Marital status: Widowed    Spouse name: Not on file   Number of children: 3   Years of education: Not on file   Highest education level: Not on file  Occupational History   Not on file  Tobacco Use   Smoking status: Never    Passive exposure: Past   Smokeless tobacco: Never  Vaping Use   Vaping status: Never Used  Substance and Sexual Activity   Alcohol use: Yes    Comment: rare   Drug use: Never   Sexual activity: Not on file  Other Topics Concern   Not on file  Social History Narrative   Not on file   Social Drivers of Health   Financial Resource Strain: Not on file  Food Insecurity: Not on file  Transportation Needs: Not on file  Physical Activity: Not on file  Stress: Not on file  Social Connections: Not on file  Intimate Partner Violence: Not on file    Family History  Problem Relation Age of Onset   Angina Mother  ROS: no fevers or chills, productive cough, hemoptysis, dysphasia, odynophagia, melena, hematochezia, dysuria, hematuria, rash, seizure activity, orthopnea, PND, pedal edema, claudication. Remaining systems are negative.  Physical Exam: Well-developed obese in no acute distress.  Skin is warm and dry.  HEENT is normal.  Neck is supple.  Chest is clear to auscultation with normal expansion.  Cardiovascular exam is regular rate and rhythm.  2/6 systolic murmur left sternal border. Abdominal exam nontender or distended. No masses palpated. Extremities show 1-2+ edema. neuro grossly intact  A/P  1 chronic diastolic congestive heart failure-patient recently discontinued her diuretic (Demadex 40 mg in the morning and 20 mg in the evening).  She felt it was contributing to renal  insufficiency.  I have explained that her dyspnea and edema may worsen off of this medication.  However she is scheduled to see nephrology tomorrow and would prefer to wait for their opinion.  I also explained we could start back at lower doses in the future.  Check potassium and renal function.  Continue Farxiga and spironolactone.    2 aortic stenosis-moderate on most recent echocardiogram.  Plan follow-up echocardiogram October 2025 or sooner if symptoms worsen.  She understands she will likely require aortic valve replacement in the future.  3 hypertension-patient's blood pressure is controlled.  Continue present medical regimen.  4 history of tachycardia-continue beta-blocker.  5 dyspnea-I have previously felt that this is multifactorial including obesity hypoventilation syndrome with possible contribution from sleep apnea as well as diastolic congestive heart failure.  Olga Millers, MD

## 2024-01-23 DIAGNOSIS — R350 Frequency of micturition: Secondary | ICD-10-CM | POA: Diagnosis not present

## 2024-01-23 DIAGNOSIS — N3946 Mixed incontinence: Secondary | ICD-10-CM | POA: Diagnosis not present

## 2024-01-24 ENCOUNTER — Encounter: Payer: Self-pay | Admitting: Cardiology

## 2024-01-24 ENCOUNTER — Ambulatory Visit: Payer: PPO | Admitting: Cardiology

## 2024-01-24 VITALS — BP 142/60 | HR 95 | Ht 67.0 in | Wt 268.0 lb

## 2024-01-24 DIAGNOSIS — I5032 Chronic diastolic (congestive) heart failure: Secondary | ICD-10-CM

## 2024-01-24 DIAGNOSIS — R0609 Other forms of dyspnea: Secondary | ICD-10-CM

## 2024-01-24 DIAGNOSIS — R944 Abnormal results of kidney function studies: Secondary | ICD-10-CM | POA: Diagnosis not present

## 2024-01-24 DIAGNOSIS — I350 Nonrheumatic aortic (valve) stenosis: Secondary | ICD-10-CM

## 2024-01-24 DIAGNOSIS — I10 Essential (primary) hypertension: Secondary | ICD-10-CM

## 2024-01-24 NOTE — Patient Instructions (Signed)
   Follow-Up: At [facility], you and your health needs are our priority.  As part of our continuing mission to provide you with exceptional heart care, we have created designated Provider Care Teams.  These Care Teams include your primary Cardiologist (physician) and Advanced Practice Providers (APPs -  Physician Assistants and Nurse Practitioners) who all work together to provide you with the care you need, when you need it.  We recommend signing up for the patient portal called "MyChart".  Sign up information is provided on this After Visit Summary.  MyChart is used to connect with patients for Virtual Visits (Telemedicine).  Patients are able to view lab/test results, encounter notes, upcoming appointments, etc.  Non-urgent messages can be sent to your provider as well.   To learn more about what you can do with MyChart, go to [email or web address].    Your next appointment:   6 month(s)  Provider:   Olga Millers, MD

## 2024-01-25 DIAGNOSIS — N1831 Chronic kidney disease, stage 3a: Secondary | ICD-10-CM | POA: Diagnosis not present

## 2024-01-25 DIAGNOSIS — I129 Hypertensive chronic kidney disease with stage 1 through stage 4 chronic kidney disease, or unspecified chronic kidney disease: Secondary | ICD-10-CM | POA: Diagnosis not present

## 2024-01-25 DIAGNOSIS — I350 Nonrheumatic aortic (valve) stenosis: Secondary | ICD-10-CM | POA: Diagnosis not present

## 2024-01-25 DIAGNOSIS — N1832 Chronic kidney disease, stage 3b: Secondary | ICD-10-CM | POA: Diagnosis not present

## 2024-01-25 DIAGNOSIS — I5032 Chronic diastolic (congestive) heart failure: Secondary | ICD-10-CM | POA: Diagnosis not present

## 2024-01-25 LAB — BASIC METABOLIC PANEL
BUN/Creatinine Ratio: 17 (ref 12–28)
BUN: 17 mg/dL (ref 8–27)
CO2: 20 mmol/L (ref 20–29)
Calcium: 9.8 mg/dL (ref 8.7–10.3)
Chloride: 105 mmol/L (ref 96–106)
Creatinine, Ser: 1.02 mg/dL — ABNORMAL HIGH (ref 0.57–1.00)
Glucose: 88 mg/dL (ref 70–99)
Potassium: 4.6 mmol/L (ref 3.5–5.2)
Sodium: 138 mmol/L (ref 134–144)
eGFR: 55 mL/min/{1.73_m2} — ABNORMAL LOW (ref >59)

## 2024-01-30 ENCOUNTER — Encounter: Payer: Self-pay | Admitting: *Deleted

## 2024-01-31 ENCOUNTER — Telehealth: Payer: Self-pay | Admitting: Cardiology

## 2024-01-31 ENCOUNTER — Telehealth: Payer: Self-pay | Admitting: Gynecology

## 2024-01-31 NOTE — Telephone Encounter (Signed)
 Received a call from patient.She stated her apple watch alerted her this morning she had afib during her sleep rate 103.She has had 3 more episodes today.Stated feels fine at present pulse 84.She is concerned she has never had afib before.Stated she does not know how to download episodes and send to [employer].Appointment scheduled with Edd Fabian NP tomorrow 3/27 at 2:45 pm at [facility] office.She will bring apple watch.Advised I will make Dr.Crenshaw aware.

## 2024-01-31 NOTE — Telephone Encounter (Signed)
 New Message:       Patient says she is wearing an Apple Watch and it  showing that she is in Afib.   Patient c/o Palpitations:  STAT if patient reporting lightheadedness, shortness of breath, or chest pain  How long have you had palpitations/irregular HR/ Afib? Are you having the symptoms now?  Was in Afib 15 minutes ago and 4 other times today- highest heart today was 114 sitting  Are you currently experiencing lightheadedness, SOB or CP? Short of breath earlier, not at this tiem  Do you have a history of afib (atrial fibrillation) or irregular heart rhythm?   Have you checked your BP or HR? (document readings if available):   Are you experiencing any other symptoms?

## 2024-01-31 NOTE — Telephone Encounter (Signed)
Did not need this encounter, wrong provider

## 2024-02-01 ENCOUNTER — Encounter: Payer: Self-pay | Admitting: General Practice

## 2024-02-01 ENCOUNTER — Ambulatory Visit: Attending: General Practice | Admitting: General Practice

## 2024-02-01 ENCOUNTER — Telehealth: Payer: Self-pay

## 2024-02-01 VITALS — BP 120/86 | HR 92 | Ht 65.0 in | Wt 257.0 lb

## 2024-02-01 DIAGNOSIS — I4891 Unspecified atrial fibrillation: Secondary | ICD-10-CM

## 2024-02-01 DIAGNOSIS — I10 Essential (primary) hypertension: Secondary | ICD-10-CM | POA: Diagnosis not present

## 2024-02-01 DIAGNOSIS — I5032 Chronic diastolic (congestive) heart failure: Secondary | ICD-10-CM

## 2024-02-01 DIAGNOSIS — I350 Nonrheumatic aortic (valve) stenosis: Secondary | ICD-10-CM | POA: Diagnosis not present

## 2024-02-01 MED ORDER — APIXABAN 5 MG PO TABS: 5.0000 mg | ORAL_TABLET | Freq: Two times a day (BID) | ORAL | 6 refills | Status: DC

## 2024-02-01 MED ORDER — METOPROLOL TARTRATE 50 MG PO TABS: 75.0000 mg | ORAL_TABLET | Freq: Two times a day (BID) | ORAL | 6 refills | Status: DC

## 2024-02-01 MED ORDER — POTASSIUM CHLORIDE CRYS ER 20 MEQ PO TBCR: 20.0000 meq | EXTENDED_RELEASE_TABLET | Freq: Every day | ORAL | 6 refills | Status: DC

## 2024-02-01 NOTE — Telephone Encounter (Signed)
 TRIED TO CALL PT-discuss mychart/password

## 2024-02-01 NOTE — Progress Notes (Signed)
 Cardiology Clinic Note   Patient Name: [name] Date of Encounter: 02/01/2024  Primary Care Provider:  Joycelyn Rua, MD Primary Cardiologist:  Olga Millers, MD  Patient Profile    [name] 83 year old female presents to the clinic today for evaluation of her accelerated heart rate.  Past Medical History    Past Medical History:  Diagnosis Date   Hypertension    Past Surgical History:  Procedure Laterality Date   DILATION AND CURETTAGE OF UTERUS     TOTAL KNEE ARTHROPLASTY Bilateral     Allergies  Allergies  Allergen Reactions   Corticosteroids Anaphylaxis    Other Reaction(s): severe HTN   Gluten Meal Other (See Comments)    Pt. Stated,"gives me sinuses."  Other Reaction(s): congestion/cough   Methylprednisolone Sodium Succ     Elevates blood pressure Headaches    History of Present Illness    [name] has a PMH of chronic diastolic CHF, aortic valve stenosis, exertional dyspnea, HTN, tachycardia, and abnormal kidney function test.  Previously she has refused all medications.  She was seen in [place] 7/22 with dyspnea and lower extremity swelling.  Her blood pressure was by report 192/93.  She underwent nuclear stress test 3/23 which showed an EF of 58% and no ischemia.  Echocardiogram 10/24 showed normal LV function, moderate LVH, G1 DD, mild-moderate left atrial enlargement, calcified aorta with moderate stenosis and a mean gradient of 16.2 mmHg.  She was seen in follow-up by Dr. Jens Som 01/24/2024.  During that time she noted some dyspnea which was unchanged.  She denied orthopnea and PND.  She had chronic pedal swelling.  She denied chest pain and syncope.  She had stopped taking her diuretics 1 and half weeks prior to the visit.  She felt this was causing kidney issues.  It was explained to her that her breathing would worsen when she was not taking the medication.  She was scheduled to see nephrology the following day.  Her  creatinine was 1.02, EGFR 55 and all of her other lab values were within normal limits.  She contacted the nurse triage line yesterday and reported that her Apple Watch was showing atrial fibrillation.  She noted that she was having intermittent episodes that would last about 15 minutes.  Her highest heart rate was noted to be 114 while sitting.  She was added to my schedule.  She presents to the clinic today for evaluation and states she has been monitoring her Apple Watch and the alerts for the last couple days.  She has had several alerts showing possible atrial fibrillation.  Her EKG today shows atrial fibrillation 92 bpm.  We reviewed the risks of atrial fibrillation.  She expressed understanding.  She reports that she was just at her nephrologist who encouraged her to restart her torsemide.  We reviewed the importance of potassium supplementation.  I will increase her metoprolol to 75 mg twice daily.  I will start apixaban 5 mg twice daily.  We reviewed options for management of atrial fibrillation/flutter.  She expressed understanding.  I will have her return to the clinic in 1 month to discuss DCCV.   Today she denies chest pain, shortness of breath,  palpitations, melena, hematuria, hemoptysis, diaphoresis, weakness, presyncope, syncope, orthopnea, and PND.      Home Medications    Prior to Admission medications   Medication Sig Start Date End Date Taking? Authorizing Provider  allopurinol (ZYLOPRIM) 100 MG tablet Take 200 mg by mouth daily.  11/09/22   [provider]  Coenzyme Q10 (CO Q-10) 200 MG CAPS Take by mouth.    [provider]  dapagliflozin propanediol (FARXIGA) 10 MG TABS tablet Take 1 tablet (10 mg total) by mouth daily before breakfast. 03/23/23   Crenshaw, Madolyn Frieze, MD  Magnesium 300 MG CAPS 1 capsule with a meal Orally Once a day    [provider]  metoprolol tartrate (LOPRESSOR) 50 MG tablet Take 50 mg by mouth 2 (two) times daily. 07/29/21    [provider]  potassium gluconate 595 (99 K) MG TABS tablet TAKE 1 TABLET BY MOUTH EVERY DAY FOR 30 DAYS    [provider]  spironolactone (ALDACTONE) 25 MG tablet Take 25 mg by mouth daily.    [provider]  torsemide (DEMADEX) 20 MG tablet Increase dose to 40 mg in the morning and keep dose at 20 mg in the afternoon. Patient not taking: Reported on 01/24/2024 09/04/23   Lewayne Bunting, MD  Turmeric (QC TUMERIC COMPLEX PO) Take by mouth.    [provider]  UNABLE TO FIND Take 2 capsules by mouth daily as needed (gout flare ups). Med Name: TART CHERRY    [provider]  vitamin B-12 (CYANOCOBALAMIN) 500 MCG tablet Take 500 mcg by mouth daily.    [provider]    Family History    Family History  Problem Relation Age of Onset   Angina Mother    She indicated that her mother is deceased. She indicated that her father is deceased.  Social History    Social History   Socioeconomic History   Marital status: Widowed    Spouse name: Not on file   Number of children: 3   Years of education: Not on file   Highest education level: Not on file  Occupational History   Not on file  Tobacco Use   Smoking status: Never    Passive exposure: Past   Smokeless tobacco: Never  Vaping Use   Vaping status: Never Used  Substance and Sexual Activity   Alcohol use: Yes    Comment: rare   Drug use: Never   Sexual activity: Not on file  Other Topics Concern   Not on file  Social History Narrative   Not on file   Social Drivers of Health   Financial Resource Strain: Not on file  Food Insecurity: Not on file  Transportation Needs: Not on file  Physical Activity: Not on file  Stress: Not on file  Social Connections: Not on file  Intimate Partner Violence: Not on file     Review of Systems    General:  No chills, fever, night sweats or weight changes.  Cardiovascular:  No chest pain, dyspnea on exertion, edema, orthopnea,  palpitations, paroxysmal nocturnal dyspnea. Dermatological: No rash, lesions/masses Respiratory: No cough, dyspnea Urologic: No hematuria, dysuria Abdominal:   No nausea, vomiting, diarrhea, bright red blood per rectum, melena, or hematemesis Neurologic:  No visual changes, wkns, changes in mental status. All other systems reviewed and are otherwise negative except as noted above.  Physical Exam    VS:  BP 120/86 (BP Location: Left Arm, Patient Position: Sitting, Cuff Size: Large)   Pulse 92   Ht 5\' 5"  (1.651 m)   Wt 257 lb (116.6 kg)   SpO2 95%   BMI 42.77 kg/m  , BMI Body mass index is 42.77 kg/m. GEN: Well nourished, well developed, in no acute distress. HEENT: normal. Neck: Supple, no  JVD, carotid bruits, or masses. Cardiac: RRR, no murmurs, rubs, or gallops. No clubbing, cyanosis, 1-2+ pitting edema to midshin.  Radials/DP/PT 2+ and equal bilaterally.  Respiratory:  Respirations regular and unlabored, clear to auscultation bilaterally. GI: Soft, nontender, nondistended, BS + x 4. MS: no deformity or atrophy. Skin: warm and dry, no rash. Neuro:  Strength and sensation are intact. Psych: Normal affect.  Accessory Clinical Findings    Recent Labs: 02/02/2023: TSH 3.170 09/11/2023: Hemoglobin 15.5; Platelets 258 11/29/2023: BNP 71.5 01/24/2024: BUN 17; Creatinine, Ser 1.02; Potassium 4.6; Sodium 138   Recent Lipid Panel No results found for: "CHOL", "TRIG", "HDL", "CHOLHDL", "VLDL", "LDLCALC", "LDLDIRECT"       ECG personally reviewed by me today- EKG Interpretation Date/Time:  Thursday February 01 2024 14:17:06 EDT Ventricular Rate:  92 PR Interval:    QRS Duration:  104 QT Interval:  366 QTC Calculation: 452 R Axis:   -57  Text Interpretation: Atrial fibrillation ,atrial flutter with premature ventricular or aberrantly conducted complexes Left anterior fascicular block Moderate voltage criteria for LVH, may be normal variant ( R in aVL , Cornell product ) When compared  with ECG of 11-Aug-2023 14:38, Confirmed by Edd Fabian [id number]) on 02/01/2024 2:42:30 PM   Echocardiogram 08/15/2023  IMPRESSIONS     1. Left ventricular ejection fraction, by estimation, is 65 to 70%. The  left ventricle has normal function. The left ventricle has no regional  wall motion abnormalities. There is moderate concentric left ventricular  hypertrophy. Left ventricular  diastolic parameters are consistent with Grade I diastolic dysfunction  (impaired relaxation).   2. Right ventricular systolic function is normal. The right ventricular  size is normal.   3. Left atrial size was mild to moderately dilated.   4. The mitral valve is normal in structure. Trivial mitral valve  regurgitation. No evidence of mitral stenosis. Moderate mitral annular  calcification.   5. The aortic valve is tricuspid. There is severe calcifcation of the  aortic valve. Aortic valve regurgitation is not visualized. Moderate  aortic valve stenosis. Aortic valve area, by VTI measures 1.20 cm. Aortic  valve mean gradient measures 16.2 mmHg.   Aortic valve Vmax measures 2.51 m/s.   6. Aortic dilatation noted. There is mild dilatation of the ascending  aorta, measuring 41 mm.   7. The inferior vena cava is normal in size with greater than 50%  respiratory variability, suggesting right atrial pressure of 3 mmHg.   FINDINGS   Left Ventricle: Left ventricular ejection fraction, by estimation, is 65  to 70%. The left ventricle has normal function. The left ventricle has no  regional wall motion abnormalities. The left ventricular internal cavity  size was normal in size. There is   moderate concentric left ventricular hypertrophy. Left ventricular  diastolic parameters are consistent with Grade I diastolic dysfunction  (impaired relaxation).   Right Ventricle: The right ventricular size is normal. No increase in  right ventricular wall thickness. Right ventricular systolic function is  normal.    Left Atrium: Left atrial size was mild to moderately dilated.   Right Atrium: Right atrial size was normal in size.   Pericardium: There is no evidence of pericardial effusion.   The mitral valve is normal in structure. Moderate mitral annular  calcification. Trivial mitral valve regurgitation. No evidence of mitral  valve stenosis.  Tricuspid Valve: The tricuspid valve is normal in structure. Tricuspid  valve regurgitation is mild . No evidence of tricuspid stenosis.   The aortic valve is  tricuspid. There is severe calcifcation of the aortic  valve. Aortic valve regurgitation is not visualized. Moderate aortic  stenosis is present.  Pulmonic Valve: The pulmonic valve was normal in structure. Pulmonic valve  regurgitation is not visualized. No evidence of pulmonic stenosis.   Aorta: Aortic dilatation noted. There is mild dilatation of the ascending  aorta, measuring 41 mm.   Venous: The inferior vena cava is normal in size with greater than 50%  respiratory variability, suggesting right atrial pressure of 3 mmHg.   IAS/Shunts: No atrial level shunt detected by color flow Doppler.      Assessment & Plan   1.  Atrial fibrillation/flutter,Palpitations-Notes that she had alerts with her Apple Watch that indicated possible atrial fibrillation.  Reports several episodes according to her watch.   Denies lightheadedness, presyncope and syncope.  DOE stable.EKG today shows Afib/flutter with pac's.  Recent lab work stable. Avoid triggers caffeine, chocolate, EtOH, dehydration etc. Start eliquis 5 mg bid Increase metoprolol to 75 mg twice daily  Diastolic CHF-continues with pedal edema.  Weight today 257 pounds and reports that she lost about 3 pounds overnight.  Previously had concerns about torsemide due to renal insufficiency.  Echocardiogram 08/15/2023 showed LVEF of 65-70% and G1 DD.  She does note that she has had 2 urinary tract infections recently.  We reviewed SGL 2T inhibitor  and I have asked her to start eating yogurt due to her recent antibiotic use. Heart healthy low-sodium diet-reviewed Daily weights Lower extremity support stockings-reviewed Elevate lower extremities when not active Continue Farxiga, metoprolol, spironolactone, torsemide  Essential hypertension-BP today 120/86. Maintain blood pressure log Heart healthy low-sodium diet Continue metoprolol  Aortic stenosis-does note some increased DOE with accelerated heart rate.  Echocardiogram 08/15/2023 showed severe calcification of the aortic valve, moderate aortic valve stenosis, mean gradient 16.2 mmHg. Plan for repeat echocardiogram when clinically indicated.  Disposition: Follow-up with Dr. Jens Som or me in  1 month.   Thomasene Ripple. Shianne Zeiser NP-C     02/01/2024, 3:10 PM [facility] [address] Office [phone] Fax [phone]    I spent 15 minutes examining this patient, reviewing medications, and using patient centered shared decision making involving their cardiac care.   I spent  20 minutes reviewing past medical history,  medications, and prior cardiac tests.

## 2024-02-01 NOTE — Patient Instructions (Addendum)
 Medication Instructions:  START ELIQUIS 5MG  TWICE DAILY INCREASE METOPROLOL TARTRATE 75MG  TWICE DAILY TAKE POTASSIUM DAILY *If you need a refill on your cardiac medications before your next appointment, please call your pharmacy*  Lab Work: NONE  Other Instructions STAY HYDRATED PLEASE PURCHASE AND WEAR COMPRESSION STOCKINGS-SEE ATTACHED PLEASE EAT ACTIVIA YOGURT  PLEASE READ AND FOLLOW ATTACHED  SALTY 6   Follow-Up: At [facility], you and your health needs are our priority.  As part of our continuing mission to provide you with exceptional heart care, we have created designated Provider Care Teams.  These Care Teams include your primary Cardiologist (physician) and Advanced Practice Providers (APPs -  Physician Assistants and Nurse Practitioners) who all work together to provide you with the care you need, when you need it.  Your next appointment:   1 month(s)  Provider:   Olga Millers, MD  or Edd Fabian, FNP

## 2024-02-16 ENCOUNTER — Encounter: Payer: Self-pay | Admitting: Primary Care

## 2024-02-16 ENCOUNTER — Ambulatory Visit: Admitting: Primary Care

## 2024-02-19 DIAGNOSIS — N1831 Chronic kidney disease, stage 3a: Secondary | ICD-10-CM | POA: Diagnosis not present

## 2024-02-19 DIAGNOSIS — N1832 Chronic kidney disease, stage 3b: Secondary | ICD-10-CM | POA: Diagnosis not present

## 2024-02-23 DIAGNOSIS — G5602 Carpal tunnel syndrome, left upper limb: Secondary | ICD-10-CM | POA: Diagnosis not present

## 2024-03-13 NOTE — Progress Notes (Signed)
 Cardiology Clinic Note   Patient Name: [name] Date of Encounter: 03/15/2024  Primary Care Provider:  Wyn Heater, MD Primary Cardiologist:  Alexandria Angel, MD  Patient Profile    [name] 83 year old female presents to the clinic today for evaluation of her accelerated heart rate.  Past Medical History    Past Medical History:  Diagnosis Date   Hypertension    Past Surgical History:  Procedure Laterality Date   DILATION AND CURETTAGE OF UTERUS     TOTAL KNEE ARTHROPLASTY Bilateral     Allergies  Allergies  Allergen Reactions   Corticosteroids Anaphylaxis    Other Reaction(s): severe HTN   Gluten Meal Other (See Comments)    Pt. Stated,"gives me sinuses."  Other Reaction(s): congestion/cough   Methylprednisolone  Sodium Succ     Elevates blood pressure Headaches    History of Present Illness    [name] has a PMH of chronic diastolic CHF, aortic valve stenosis, exertional dyspnea, HTN, tachycardia, and abnormal kidney function test.  Previously she has refused all medications.  She was seen in [place]  7/22 with dyspnea and lower extremity swelling.  Her blood pressure was by report 192/93.  She underwent nuclear stress test 3/23 which showed an EF of 58% and no ischemia.  Echocardiogram 10/24 showed normal LV function, moderate LVH, G1 DD, mild-moderate left atrial enlargement, calcified aorta with moderate stenosis and a mean gradient of 16.2 mmHg.  She was seen in follow-up by Dr. Audery Blazing 01/24/2024.  During that time she noted some dyspnea which was unchanged.  She denied orthopnea and PND.  She had chronic pedal swelling.  She denied chest pain and syncope.  She had stopped taking her diuretics 1 and half weeks prior to the visit.  She felt this was causing kidney issues.  It was explained to her that her breathing would worsen when she was not taking the medication.  She was scheduled to see nephrology the following day.  Her  creatinine was 1.02, EGFR 55 and all of her other lab values were within normal limits.  She contacted the nurse triage line yesterday and reported that her Apple Watch was showing atrial fibrillation.  She noted that she was having intermittent episodes that would last about 15 minutes.  Her highest heart rate was noted to be 114 while sitting.  She was added to my schedule.  She presented  to the clinic 02/01/24 for evaluation and stated she had been monitoring her Apple Watch and the alerts for a couple days.  She  had several alerts showing possible atrial fibrillation.  Her EKG  showed atrial fibrillation 92 bpm.  We reviewed the risks of atrial fibrillation.  She expressed understanding.  She reported that she had been to her nephrologist who encouraged her to restart her torsemide .  We reviewed the importance of potassium supplementation.  I increased her metoprolol  to 75 mg twice daily.  I  started apixaban  5 mg twice daily.  We reviewed options for management of atrial fibrillation/flutter.  She expressed understanding.  I planned return to the clinic in 1 month to discuss DCCV.  She presents to the clinic today for follow-up evaluation and states she converted to sinus rhythm on April 13.  She continues to monitor heart rate with her watch.  She is tolerating her medications well.  She does note that she would like patient assistance forms for her Farxiga  and Eliquis .  She would also like 90-day supply.  She  notes that she has also run out of her spironolactone.  She has been out of this for almost 1 month.  She reports that she plans to have left carpal tunnel release in the near future.  She is well compensated at this time and would be acceptable risk for surgery.  I have asked her to provide us  with formal preoperative clearance request.  She expressed understanding.  She notes that she is not happy with her primary care provider.  (Dr. De [place]).  She asked for recommendations for new primary  care.  I will continue her current medication regimen and plan follow-up in 6 to 9 months..   Today she denies chest pain, shortness of breath,  palpitations, melena, hematuria, hemoptysis, diaphoresis, weakness, presyncope, syncope, orthopnea, and PND.      Home Medications    Prior to Admission medications   Medication Sig Start Date End Date Taking? Authorizing Provider  allopurinol (ZYLOPRIM) 100 MG tablet Take 200 mg by mouth daily. 11/09/22   [provider]  Coenzyme Q10 (CO Q-10) 200 MG CAPS Take by mouth.    [provider]  dapagliflozin  propanediol (FARXIGA ) 10 MG TABS tablet Take 1 tablet (10 mg total) by mouth daily before breakfast. 03/23/23   Lenise Quince, MD  Magnesium 300 MG CAPS 1 capsule with a meal Orally Once a day    [provider]  metoprolol  tartrate (LOPRESSOR ) 50 MG tablet Take 50 mg by mouth 2 (two) times daily. 07/29/21   [provider]  potassium gluconate 595 (99 K) MG TABS tablet TAKE 1 TABLET BY MOUTH EVERY DAY FOR 30 DAYS    [provider]  spironolactone (ALDACTONE) 25 MG tablet Take 25 mg by mouth daily.    [provider]  torsemide  (DEMADEX ) 20 MG tablet Increase dose to 40 mg in the morning and keep dose at 20 mg in the afternoon. Patient not taking: Reported on 01/24/2024 09/04/23   Lenise Quince, MD  Turmeric (QC TUMERIC COMPLEX PO) Take by mouth.    [provider]  UNABLE TO FIND Take 2 capsules by mouth daily as needed (gout flare ups). Med Name: TART CHERRY    [provider]  vitamin B-12 (CYANOCOBALAMIN) 500 MCG tablet Take 500 mcg by mouth daily.    [provider]    Family History    Family History  Problem Relation Age of Onset   Angina Mother    She indicated that her mother is deceased. She indicated that her father is deceased.  Social History    Social History   Socioeconomic History   Marital status: Widowed    Spouse name: Not on file    Number of children: 3   Years of education: Not on file   Highest education level: Not on file  Occupational History   Not on file  Tobacco Use   Smoking status: Never    Passive exposure: Past   Smokeless tobacco: Never  Vaping Use   Vaping status: Never Used  Substance and Sexual Activity   Alcohol use: Yes    Comment: rare   Drug use: Never   Sexual activity: Not on file  Other Topics Concern   Not on file  Social History Narrative   Not on file   Social Drivers of Health   Financial Resource Strain: Not on file  Food Insecurity: Not on file  Transportation Needs: Not on file  Physical Activity: Not on file  Stress:  Not on file  Social Connections: Not on file  Intimate Partner Violence: Not on file     Review of Systems    General:  No chills, fever, night sweats or weight changes.  Cardiovascular:  No chest pain, dyspnea on exertion, edema, orthopnea, palpitations, paroxysmal nocturnal dyspnea. Dermatological: No rash, lesions/masses Respiratory: No cough, dyspnea Urologic: No hematuria, dysuria Abdominal:   No nausea, vomiting, diarrhea, bright red blood per rectum, melena, or hematemesis Neurologic:  No visual changes, wkns, changes in mental status. All other systems reviewed and are otherwise negative except as noted above.  Physical Exam    VS:  BP 116/68 (BP Location: Left Arm, Patient Position: Sitting, Cuff Size: Normal)   Pulse 70   Ht 5\' 6"  (1.676 m)   Wt 256 lb 12.8 oz (116.5 kg)   SpO2 97%   BMI 41.45 kg/m  , BMI Body mass index is 41.45 kg/m. GEN: Well nourished, well developed, in no acute distress. HEENT: normal. Neck: Supple, no JVD, carotid bruits, or masses. Cardiac: RRR, no murmurs, rubs, or gallops. No clubbing, cyanosis, 1-2+ pitting edema to midshin stable.  Radials/DP/PT 2+ and equal bilaterally.  Respiratory:  Respirations regular and unlabored, clear to auscultation bilaterally. GI: Soft, nontender, nondistended, BS + x  4. MS: no deformity or atrophy. Skin: warm and dry, no rash. Neuro:  Strength and sensation are intact. Psych: Normal affect.  Accessory Clinical Findings    Recent Labs: 09/11/2023: Hemoglobin 15.5; Platelets 258 11/29/2023: BNP 71.5 01/24/2024: BUN 17; Creatinine, Ser 1.02; Potassium 4.6; Sodium 138   Recent Lipid Panel No results found for: "CHOL", "TRIG", "HDL", "CHOLHDL", "VLDL", "LDLCALC", "LDLDIRECT"       ECG personally reviewed by me today-   sinus rhythm right bundle branch block left anterior fascicular block 69 bpm  Echocardiogram 08/15/2023  IMPRESSIONS     1. Left ventricular ejection fraction, by estimation, is 65 to 70%. The  left ventricle has normal function. The left ventricle has no regional  wall motion abnormalities. There is moderate concentric left ventricular  hypertrophy. Left ventricular  diastolic parameters are consistent with Grade I diastolic dysfunction  (impaired relaxation).   2. Right ventricular systolic function is normal. The right ventricular  size is normal.   3. Left atrial size was mild to moderately dilated.   4. The mitral valve is normal in structure. Trivial mitral valve  regurgitation. No evidence of mitral stenosis. Moderate mitral annular  calcification.   5. The aortic valve is tricuspid. There is severe calcifcation of the  aortic valve. Aortic valve regurgitation is not visualized. Moderate  aortic valve stenosis. Aortic valve area, by VTI measures 1.20 cm. Aortic  valve mean gradient measures 16.2 mmHg.   Aortic valve Vmax measures 2.51 m/s.   6. Aortic dilatation noted. There is mild dilatation of the ascending  aorta, measuring 41 mm.   7. The inferior vena cava is normal in size with greater than 50%  respiratory variability, suggesting right atrial pressure of 3 mmHg.   FINDINGS   Left Ventricle: Left ventricular ejection fraction, by estimation, is 65  to 70%. The left ventricle has normal function. The left  ventricle has no  regional wall motion abnormalities. The left ventricular internal cavity  size was normal in size. There is   moderate concentric left ventricular hypertrophy. Left ventricular  diastolic parameters are consistent with Grade I diastolic dysfunction  (impaired relaxation).   Right Ventricle: The right ventricular size is normal. No increase in  right ventricular wall thickness. Right ventricular systolic function is  normal.   Left Atrium: Left atrial size was mild to moderately dilated.   Right Atrium: Right atrial size was normal in size.   Pericardium: There is no evidence of pericardial effusion.   The mitral valve is normal in structure. Moderate mitral annular  calcification. Trivial mitral valve regurgitation. No evidence of mitral  valve stenosis.  Tricuspid Valve: The tricuspid valve is normal in structure. Tricuspid  valve regurgitation is mild . No evidence of tricuspid stenosis.   The aortic valve is tricuspid. There is severe calcifcation of the aortic  valve. Aortic valve regurgitation is not visualized. Moderate aortic  stenosis is present.  Pulmonic Valve: The pulmonic valve was normal in structure. Pulmonic valve  regurgitation is not visualized. No evidence of pulmonic stenosis.   Aorta: Aortic dilatation noted. There is mild dilatation of the ascending  aorta, measuring 41 mm.   Venous: The inferior vena cava is normal in size with greater than 50%  respiratory variability, suggesting right atrial pressure of 3 mmHg.   IAS/Shunts: No atrial level shunt detected by color flow Doppler.      Assessment & Plan   1.  Atrial fibrillation/flutter,Palpitations-EKG today shows sinus rhythm 69 bpm.  Continues to deny lightheadedness, presyncope and syncope.  DOE remains stable.reports compliance with the Eliquis .  Denies bleeding issues.  Denies missed doses.  Notes that she is having a hard time with the cost of Farxiga  and Eliquis .  I will give  her patient assistance information.  She would also like a 90-day supply of her medicine for Eliquis  and Farxiga . Avoid triggers caffeine, chocolate, EtOH, dehydration etc. Continue Eliquis , metoprolol  to 75 mg twice daily   Diastolic CHF-euvolemic.  Weight today 256 pounds.  She previously had concerns about torsemide  due to renal insufficiency.  Echocardiogram 08/15/2023 showed LVEF of 65-70% and G1 DD.  She does note that she has had 2 urinary tract infections recently.  We reviewed SGL 2T inhibitor and I have asked her to start eating yogurt due to her recent antibiotic use. Heart healthy low-sodium diet-reviewed Daily weights Continue lower extremity support stockings Elevate lower extremities when not active-reviewed Continue Farxiga , metoprolol , spironolactone, torsemide -refill spironolactone  Essential hypertension-BP today 116/68 Continue current medical therapy Continue blood pressure log  Aortic stenosis-stable.  Her echocardiogram 08/15/2023 showed severe calcification of the aortic valve, moderate aortic valve stenosis, mean gradient 16.2 mmHg. Plan for repeat echocardiogram when clinically indicated.    Preop- left carpal tunnel- 03/28/24, Dr. Eugene Hertz, left carpal tunnel release.  She is well compensated.  She would be acceptable risk from a cardiac standpoint to proceed with surgery.  I have asked her to have the surgeons office send us  a formal preoperative clearance request.   Disposition: Follow-up with Dr. Audery Blazing or me 6-9 months.     Chet Cota. Peytan Andringa NP-C     03/15/2024, 2:29 PM [facility] [address] Office [phone] Fax [phone]    I spent 15 minutes examining this patient, reviewing medications, and using patient centered shared decision making involving their cardiac care.   I spent  20 minutes reviewing past medical history,  medications, and prior cardiac tests.

## 2024-03-15 ENCOUNTER — Encounter: Payer: Self-pay | Admitting: General Practice

## 2024-03-15 ENCOUNTER — Ambulatory Visit: Attending: General Practice | Admitting: General Practice

## 2024-03-15 VITALS — BP 116/68 | HR 70 | Ht 66.0 in | Wt 256.8 lb

## 2024-03-15 DIAGNOSIS — I5032 Chronic diastolic (congestive) heart failure: Secondary | ICD-10-CM

## 2024-03-15 DIAGNOSIS — I10 Essential (primary) hypertension: Secondary | ICD-10-CM | POA: Diagnosis not present

## 2024-03-15 DIAGNOSIS — I4891 Unspecified atrial fibrillation: Secondary | ICD-10-CM

## 2024-03-15 DIAGNOSIS — I350 Nonrheumatic aortic (valve) stenosis: Secondary | ICD-10-CM

## 2024-03-15 MED ORDER — SPIRONOLACTONE 25 MG PO TABS: 25.0000 mg | ORAL_TABLET | Freq: Every day | ORAL | 3 refills | Status: DC

## 2024-03-15 NOTE — Patient Instructions (Signed)
 Medication Instructions:  Your physician recommends that you continue on your current medications as directed. Please refer to the Current Medication list given to you today.  *If you need a refill on your cardiac medications before your next appointment, please call your pharmacy*  Lab Work: NONE If you have labs (blood work) drawn today and your tests are completely normal, you will receive your results only by: MyChart Message (if you have MyChart) OR A paper copy in the mail If you have any lab test that is abnormal or we need to change your treatment, we will call you to review the results.  Testing/Procedures: NONE  Follow-Up: At [facility], you and your health needs are our priority.  As part of our continuing mission to provide you with exceptional heart care, our providers are all part of one team.  This team includes your primary Cardiologist (physician) and Advanced Practice Providers or APPs (Physician Assistants and Nurse Practitioners) who all work together to provide you with the care you need, when you need it.  Your next appointment:   6-9 month(s)  Provider:   Alexandria Angel, MD or Lawana Pray, NP  We recommend signing up for the patient portal called "MyChart".  Sign up information is provided on this After Visit Summary.  MyChart is used to connect with patients for Virtual Visits (Telemedicine).  Patients are able to view lab/test results, encounter notes, upcoming appointments, etc.  Non-urgent messages can be sent to your provider as well.   To learn more about what you can do with MyChart, go to [email or web address].   Other Instructions

## 2024-03-18 DIAGNOSIS — N1832 Chronic kidney disease, stage 3b: Secondary | ICD-10-CM | POA: Diagnosis not present

## 2024-03-22 DIAGNOSIS — B351 Tinea unguium: Secondary | ICD-10-CM | POA: Diagnosis not present

## 2024-03-23 ENCOUNTER — Other Ambulatory Visit: Payer: Self-pay | Admitting: Cardiology

## 2024-03-23 DIAGNOSIS — R0609 Other forms of dyspnea: Secondary | ICD-10-CM

## 2024-03-23 DIAGNOSIS — I5032 Chronic diastolic (congestive) heart failure: Secondary | ICD-10-CM

## 2024-03-25 ENCOUNTER — Other Ambulatory Visit: Payer: Self-pay | Admitting: Cardiology

## 2024-03-25 DIAGNOSIS — I350 Nonrheumatic aortic (valve) stenosis: Secondary | ICD-10-CM | POA: Diagnosis not present

## 2024-03-25 DIAGNOSIS — I5032 Chronic diastolic (congestive) heart failure: Secondary | ICD-10-CM

## 2024-03-25 DIAGNOSIS — R0609 Other forms of dyspnea: Secondary | ICD-10-CM

## 2024-03-25 DIAGNOSIS — N2581 Secondary hyperparathyroidism of renal origin: Secondary | ICD-10-CM | POA: Diagnosis not present

## 2024-03-25 DIAGNOSIS — I129 Hypertensive chronic kidney disease with stage 1 through stage 4 chronic kidney disease, or unspecified chronic kidney disease: Secondary | ICD-10-CM | POA: Diagnosis not present

## 2024-03-25 DIAGNOSIS — N1831 Chronic kidney disease, stage 3a: Secondary | ICD-10-CM | POA: Diagnosis not present

## 2024-03-28 DIAGNOSIS — G5602 Carpal tunnel syndrome, left upper limb: Secondary | ICD-10-CM | POA: Diagnosis not present

## 2024-04-08 DIAGNOSIS — R197 Diarrhea, unspecified: Secondary | ICD-10-CM | POA: Diagnosis not present

## 2024-04-08 DIAGNOSIS — Z860100 Personal history of colon polyps, unspecified: Secondary | ICD-10-CM | POA: Diagnosis not present

## 2024-04-09 ENCOUNTER — Telehealth: Payer: Self-pay

## 2024-04-09 NOTE — Telephone Encounter (Signed)
   Pre-operative Risk Assessment    Patient Name: [name]  DOB: 12/08/1940 MRN: [id number]   Date of last office visit: 03/15/24 with Oletta Berry, NP Date of next office visit: None   Request for Surgical Clearance    Procedure:  Colonoscopy   Date of Surgery:  Clearance TBD                                Surgeon:  Dr. Sterling Eisenmenger Group or Practice Name:  [facility] Phone number:  [phone] Fax number:  [phone]   Type of Clearance Requested:   - Medical  - Pharmacy:  Hold Apixaban  (Eliquis ) 2 days prior to procedure   Type of Anesthesia:  Propofol    Additional requests/questions:    Kenny Peals   04/09/2024, 8:42 AM

## 2024-04-10 DIAGNOSIS — R197 Diarrhea, unspecified: Secondary | ICD-10-CM | POA: Diagnosis not present

## 2024-04-10 NOTE — Telephone Encounter (Signed)
 Patient with diagnosis of afib on Eliquis  for anticoagulation.    Procedure: colonoscopy Date of procedure: TBD   CHA2DS2-VASc Score = 5   This indicates a 7.2% annual risk of stroke. The patient's score is based upon: CHF History: 1 HTN History: 1 Diabetes History: 0 Stroke History: 0 Vascular Disease History: 0 Age Score: 2 Gender Score: 1      CrCl 54 ml/min Platelet count 227  Patient has not had an Afib/aflutter ablation within the last 3 months or DCCV within the last 30 days  Per office protocol, patient can hold Eliquis  for 2 days prior to procedure.    **This guidance is not considered finalized until pre-operative APP has relayed final recommendations.**

## 2024-04-10 NOTE — Telephone Encounter (Signed)
   Patient Name: KOREY ARROYO  DOB: 1941/04/21 MRN: [id number]  Primary Cardiologist: Alexandria Angel, MD  Chart reviewed as part of pre-operative protocol coverage. Given past medical history and time since last visit, based on ACC/AHA guidelines, SHANDREA LUSK is at acceptable risk for the planned procedure without further cardiovascular testing.   The patient was advised that if she develops new symptoms prior to surgery to contact our office to arrange for a follow-up visit, and she verbalized understanding.  Per office protocol, patient can hold Eliquis  for 2 days prior to procedure.      I will route this recommendation to the requesting party via Epic fax function and remove from pre-op pool.  Please call with questions.  Francene Ing, Retha Cast, NP 04/10/2024, 4:42 PM

## 2024-04-11 DIAGNOSIS — Z4889 Encounter for other specified surgical aftercare: Secondary | ICD-10-CM | POA: Diagnosis not present

## 2024-04-13 IMAGING — DX DG CHEST 2V
2 series · 2 of 2 positions shown · non-contrast
Comparison: 01/21/2009

CLINICAL DATA: Dyspnea on exertion

EXAM:
CHEST - 2 VIEW

[chest pa]
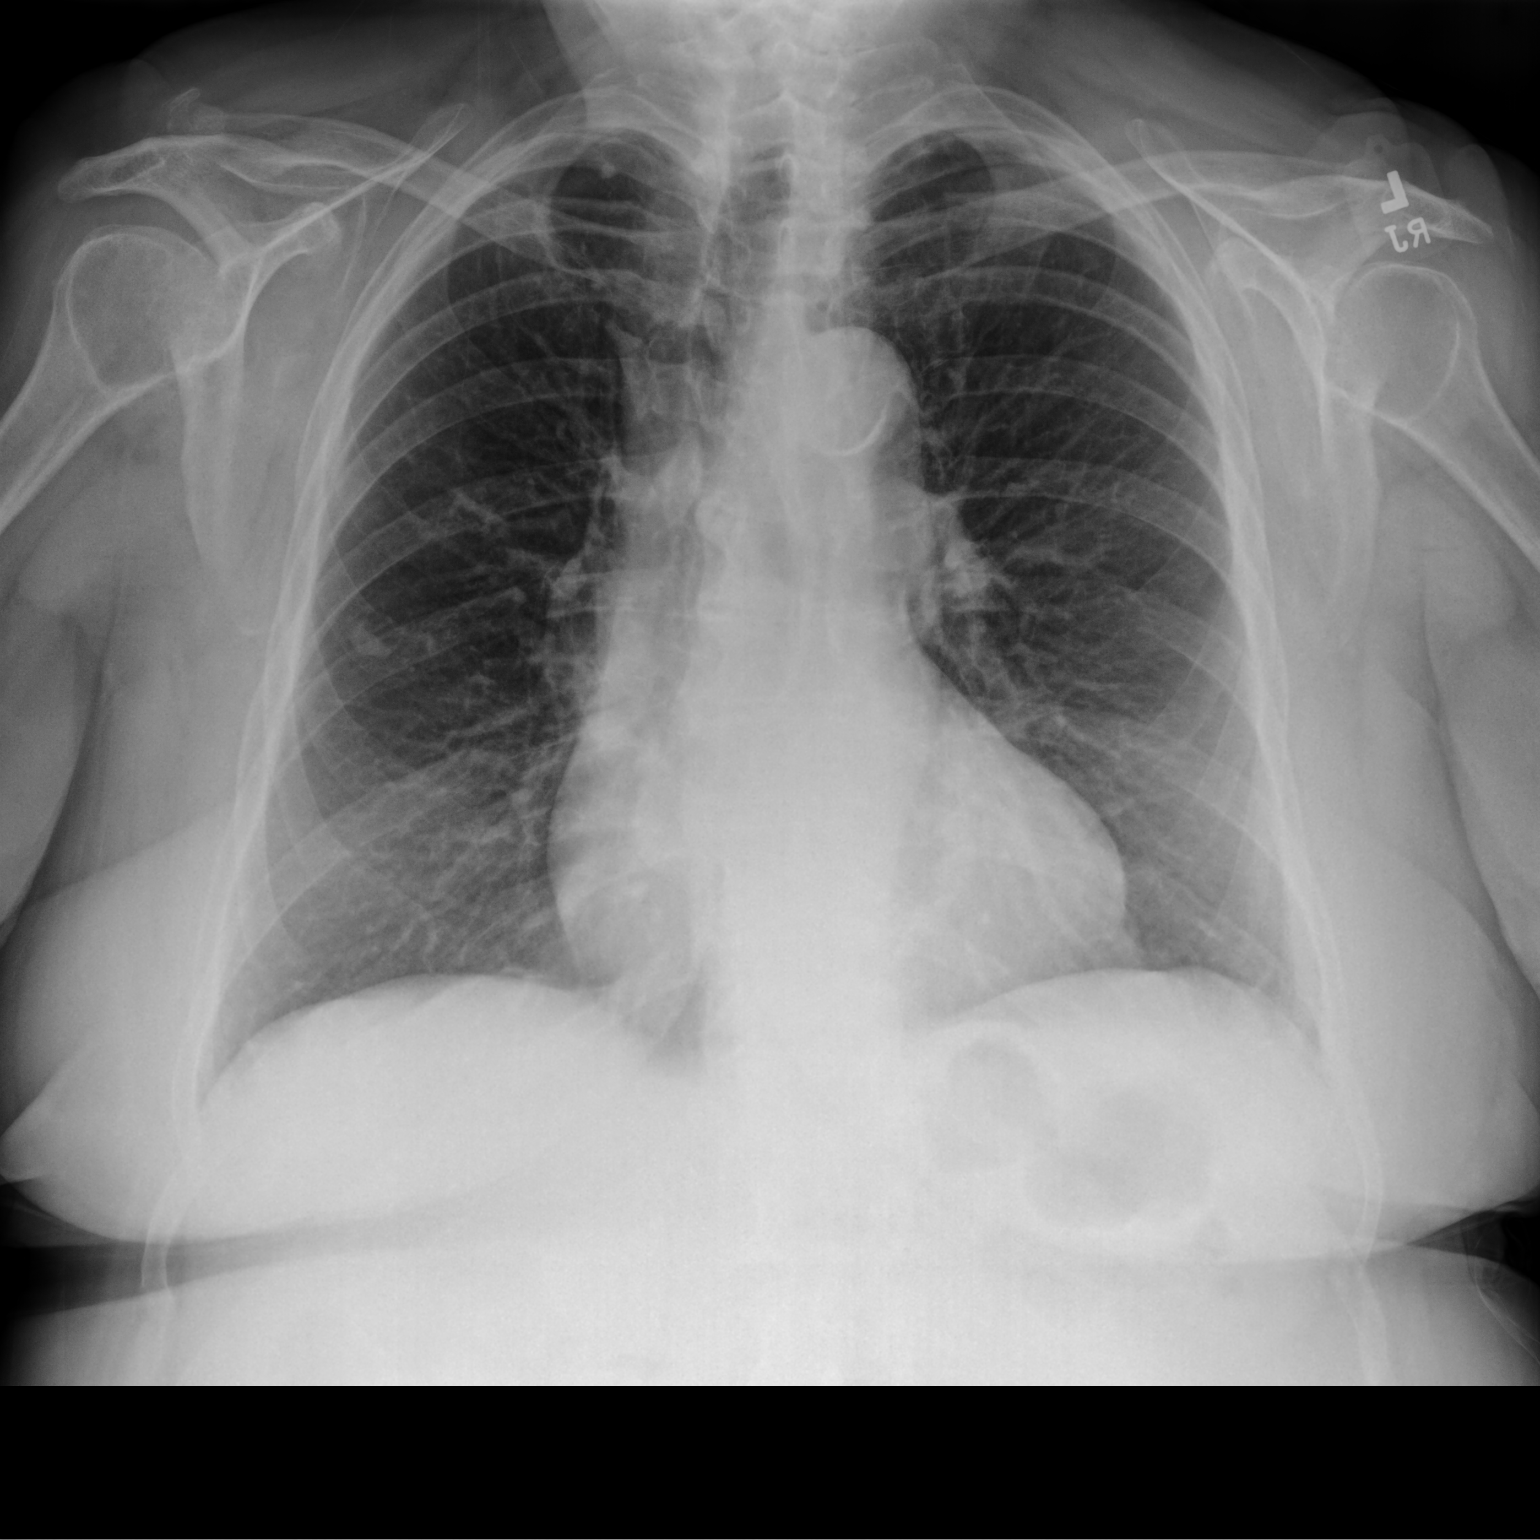

[chest lat]
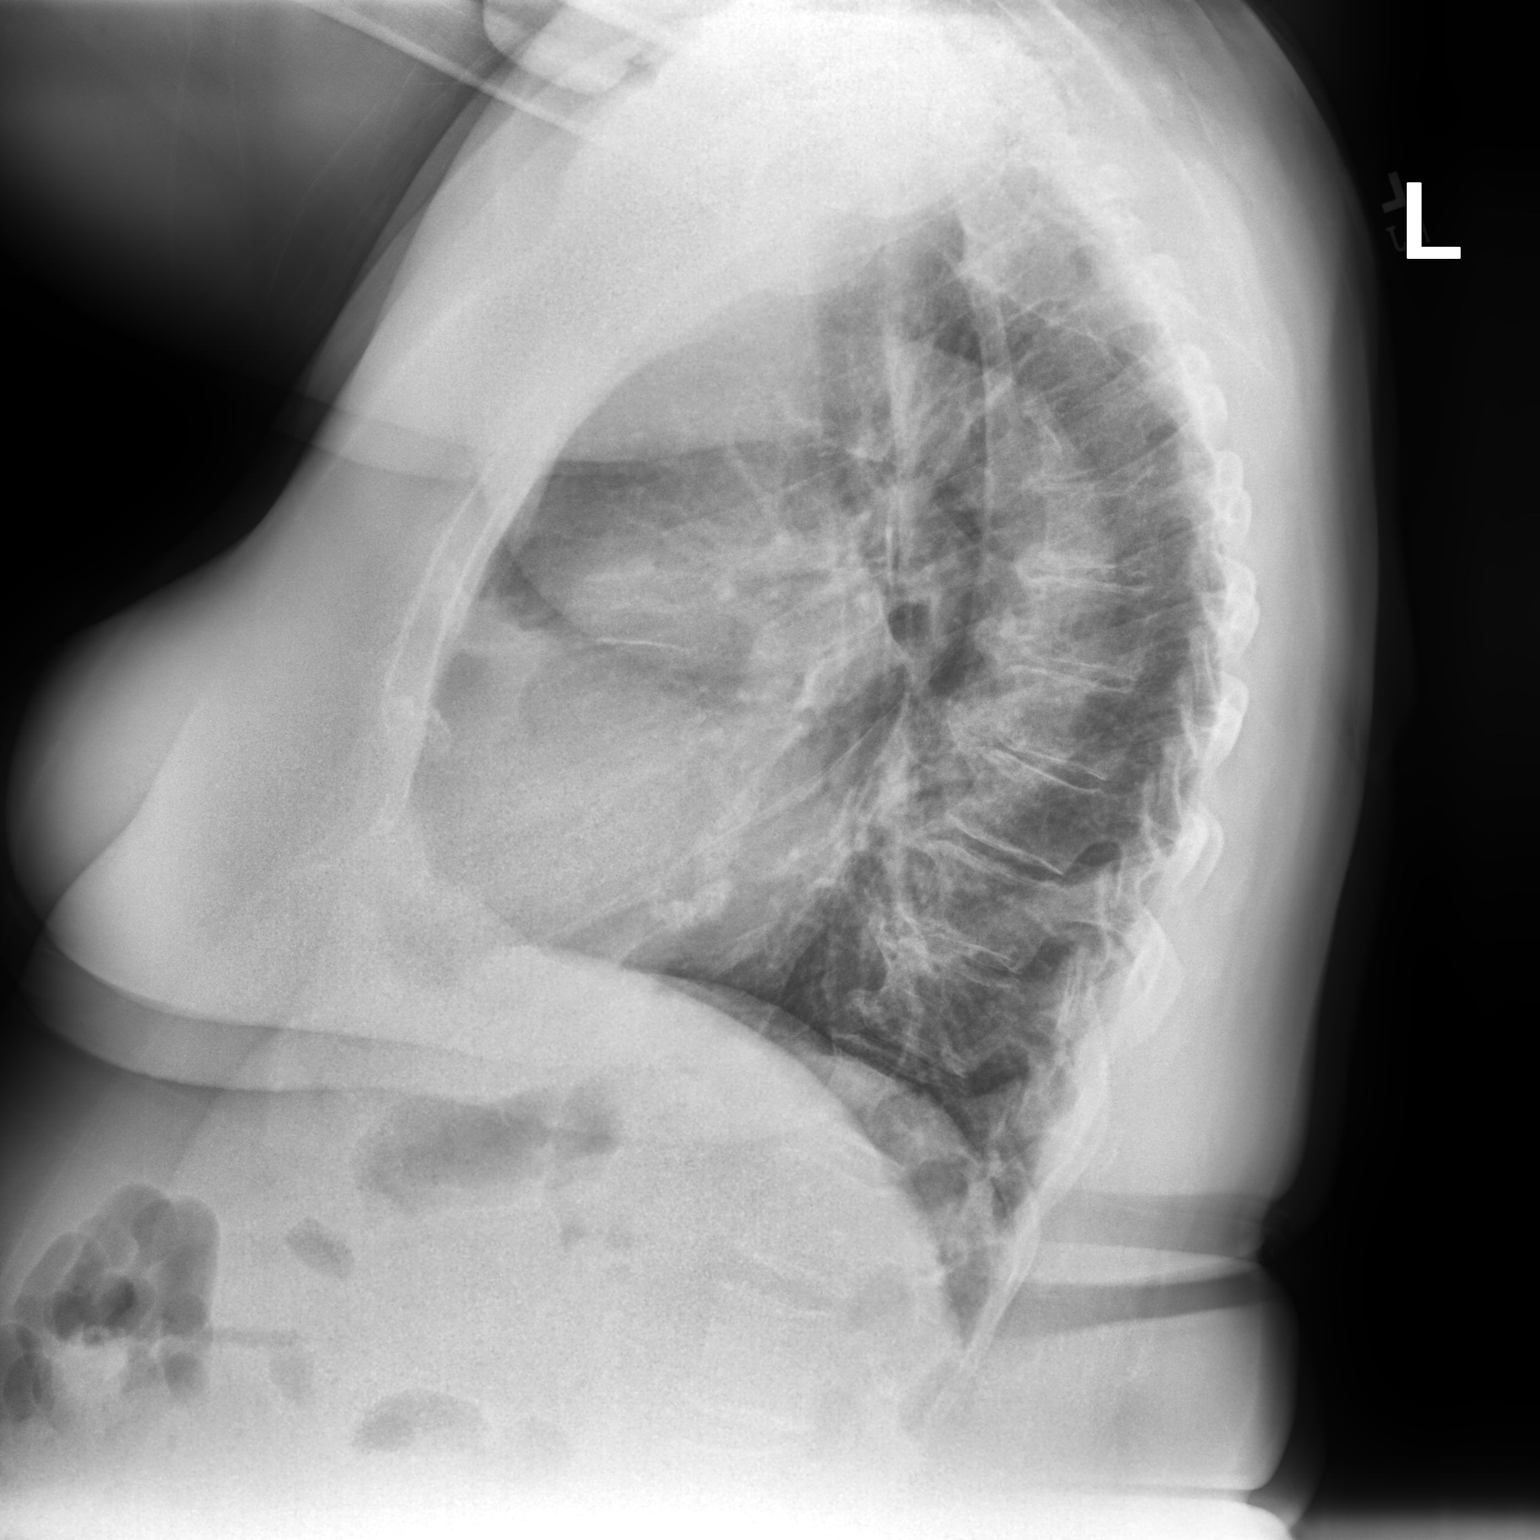

[2 of 2 positions shown; findings below may reference images not displayed]

FINDINGS: Frontal and lateral views of the chest demonstrate an unremarkable
cardiac silhouette. Dense calcification of the mitral annulus. No
airspace disease, effusion, or pneumothorax. Calcifications
overlying the right apex and right anterior fourth rib consistent
with calcified granulomas. No acute bony abnormalities.
IMPRESSION: 1. No acute intrathoracic process.
2. Mitral valve calcifications.

## 2024-04-16 ENCOUNTER — Encounter (HOSPITAL_BASED_OUTPATIENT_CLINIC_OR_DEPARTMENT_OTHER): Payer: Self-pay

## 2024-04-16 ENCOUNTER — Ambulatory Visit (INDEPENDENT_AMBULATORY_CARE_PROVIDER_SITE_OTHER): Admitting: Family Medicine

## 2024-04-16 ENCOUNTER — Encounter (HOSPITAL_BASED_OUTPATIENT_CLINIC_OR_DEPARTMENT_OTHER): Payer: Self-pay | Admitting: Family Medicine

## 2024-04-16 ENCOUNTER — Encounter (HOSPITAL_BASED_OUTPATIENT_CLINIC_OR_DEPARTMENT_OTHER): Payer: Self-pay | Admitting: *Deleted

## 2024-04-16 VITALS — BP 118/75 | HR 63 | Ht 66.0 in | Wt 257.0 lb

## 2024-04-16 DIAGNOSIS — Z0000 Encounter for general adult medical examination without abnormal findings: Secondary | ICD-10-CM | POA: Diagnosis not present

## 2024-04-16 DIAGNOSIS — Z780 Asymptomatic menopausal state: Secondary | ICD-10-CM | POA: Diagnosis not present

## 2024-04-16 DIAGNOSIS — R7303 Prediabetes: Secondary | ICD-10-CM | POA: Insufficient documentation

## 2024-04-16 DIAGNOSIS — N1831 Chronic kidney disease, stage 3a: Secondary | ICD-10-CM | POA: Diagnosis not present

## 2024-04-16 DIAGNOSIS — I700 Atherosclerosis of aorta: Secondary | ICD-10-CM | POA: Insufficient documentation

## 2024-04-16 DIAGNOSIS — K760 Fatty (change of) liver, not elsewhere classified: Secondary | ICD-10-CM | POA: Diagnosis not present

## 2024-04-16 DIAGNOSIS — I77810 Thoracic aortic ectasia: Secondary | ICD-10-CM | POA: Insufficient documentation

## 2024-04-16 DIAGNOSIS — I499 Cardiac arrhythmia, unspecified: Secondary | ICD-10-CM | POA: Insufficient documentation

## 2024-04-16 DIAGNOSIS — I10 Essential (primary) hypertension: Secondary | ICD-10-CM

## 2024-04-16 DIAGNOSIS — R32 Unspecified urinary incontinence: Secondary | ICD-10-CM | POA: Insufficient documentation

## 2024-04-16 DIAGNOSIS — I5032 Chronic diastolic (congestive) heart failure: Secondary | ICD-10-CM | POA: Insufficient documentation

## 2024-04-16 DIAGNOSIS — E559 Vitamin D deficiency, unspecified: Secondary | ICD-10-CM | POA: Diagnosis not present

## 2024-04-16 NOTE — Patient Instructions (Signed)
  Medication Instructions:  Your physician recommends that you continue on your current medications as directed. Please refer to the Current Medication list given to you today. --If you need a refill on any your medications before your next appointment, please call your pharmacy first. If no refills are authorized on file call the office.-- Lab Work: Your physician has recommended that you have lab work today: today If you have labs (blood work) drawn today and your tests are completely normal, you will receive your results via MyChart message OR a phone call from our staff.  Please ensure you check your voicemail in the event that you authorized detailed messages to be left on a delegated number. If you have any lab test that is abnormal or we need to change your treatment, we will call you to review the results.  Follow-Up: Your next appointment:   Your physician recommends that you schedule a follow-up appointment in: 1 month physical with Dr. de Peru  You will receive a text message or e-mail with a link to a survey about your care and experience with [place] today! We would greatly appreciate your feedback!   Thanks for letting us be apart of your health journey!!  Primary Care and Sports Medicine   Dr. Ceasar Mons Peru   We encourage you to activate your patient portal called "MyChart".  Sign up information is provided on this After Visit Summary.  MyChart is used to connect with patients for Virtual Visits (Telemedicine).  Patients are able to view lab/test results, encounter notes, upcoming appointments, etc.  Non-urgent messages can be sent to your provider as well. To learn more about what you can do with MyChart, please visit --  [email or web address].

## 2024-04-16 NOTE — Assessment & Plan Note (Signed)
 Currently taking vitamin D supplement.  We can proceed with recheck vitamin D level today

## 2024-04-16 NOTE — Assessment & Plan Note (Signed)
 Noted in the past with A1c slightly above normal range of 5.7 on abstract results.  This was checked last year.  We can proceed with recheck of this with labs today as part of upcoming physical

## 2024-04-16 NOTE — Progress Notes (Signed)
 New Patient Office Visit  Subjective   Patient ID: [name], female    DOB: 01-01-1941  Age: 83 y.o. MRN: [id number]  CC:  Chief Complaint  Patient presents with   New Patient (Initial Visit)    New patient - was seeing [name] no issues just establishing care would like to discuss fatty liver     HPI [name] presents to establish care Last PCP - [name] with [name] with cardiology related to chronic diastolic CHF, aortic valve stenosis, hypertension, atrial fibrillation.  Reviewed medications as below.  No specific concerns related to medication at this time.  Denies any bleeding concerns with being on Eliquis .  Next appoint with cardiology is in November.  Had recently seen GI related to diarrhea, diagnosed with norovirus.  She did have fecal testing completed which was reassuring.  Patient also with history of chronic kidney disease, stage III.  She does follow with nephrology related to this as well.  She primarily was seeing a PA at prior PCP office and that PA has left and this is ultimately what contributed to her looking for a new primary care office.  Patient moved here from [place] . Moved here in 2002. She enjoys walking in the pool. She enjoys playing games, quilting.  Outpatient Encounter Medications as of 04/16/2024  Medication Sig   apixaban  (ELIQUIS ) 5 MG TABS tablet Take 1 tablet (5 mg total) by mouth 2 (two) times daily.   Ascorbic Acid (VITAMIN C PO) Take by mouth daily.   Cholecalciferol (VITAMIN D3) 50 MCG (2000 UT) CAPS Take 2,000 Units by mouth every morning.   Coenzyme Q10 (CO Q-10) 200 MG CAPS Take by mouth.   dapagliflozin  propanediol (FARXIGA ) 10 MG TABS tablet Take 1 tablet (10 mg total) by mouth daily before breakfast.   Magnesium 300 MG CAPS Take 600 mg by mouth at bedtime.   metoprolol  tartrate (LOPRESSOR ) 50 MG tablet Take 1.5 tablets (75 mg total) by mouth 2 (two) times daily.   POTASSIUM CITRATE PO Take by  mouth at bedtime.   spironolactone  (ALDACTONE ) 25 MG tablet Take 1 tablet (25 mg total) by mouth daily.   torsemide  (DEMADEX ) 20 MG tablet Increase dose to 40 mg in the morning and keep dose at 20 mg in the afternoon.   Turmeric (QC TUMERIC COMPLEX PO) Take by mouth.   vitamin B-12 (CYANOCOBALAMIN) 500 MCG tablet Take 500 mcg by mouth daily.   allopurinol (ZYLOPRIM) 100 MG tablet Take 200 mg by mouth daily. (Patient not taking: Reported on 04/16/2024)   [DISCONTINUED] nitrofurantoin (MACRODANTIN) 100 MG capsule Take 100 mg by mouth 2 (two) times daily. (Patient not taking: Reported on 03/15/2024)   [DISCONTINUED] OVER THE COUNTER MEDICATION Take 1 capsule by mouth daily at 6 (six) AM. LYMPH   [DISCONTINUED] potassium chloride  SA (KLOR-CON  M20) 20 MEQ tablet Take 1 tablet (20 mEq total) by mouth daily. (Patient not taking: Reported on 03/15/2024)   [DISCONTINUED] potassium gluconate 595 (99 K) MG TABS tablet TAKE 1 TABLET BY MOUTH EVERY DAY FOR 30 DAYS (Patient not taking: Reported on 03/15/2024)   [DISCONTINUED] UNABLE TO FIND Take 2 capsules by mouth daily as needed (gout flare ups). Med Name: [name] (Patient not taking: Reported on 03/15/2024)   No facility-administered encounter medications on file as of 04/16/2024.    Past Medical History:  Diagnosis Date   A-fib [facility])    Aortic stenosis    Congestive heart failure (CHF) (HCC)  Hypertension     Past Surgical History:  Procedure Laterality Date   BILATERAL CARPAL TUNNEL RELEASE     DILATION AND CURETTAGE OF UTERUS     TOTAL KNEE ARTHROPLASTY Bilateral     Family History  Problem Relation Age of Onset   Angina Mother     Social History   Socioeconomic History   Marital status: Widowed    Spouse name: Not on file   Number of children: 3   Years of education: Not on file   Highest education level: Not on file  Occupational History   Not on file  Tobacco Use   Smoking status: Never    Passive exposure: Never   Smokeless  tobacco: Never  Vaping Use   Vaping status: Never Used  Substance and Sexual Activity   Alcohol use: Yes    Comment: rare   Drug use: Never   Sexual activity: Not on file  Other Topics Concern   Not on file  Social History Narrative   Not on file   Social Drivers of Health   Financial Resource Strain: Not on file  Food Insecurity: Low Risk  (04/08/2024)   Received from [facility]   Hunger Vital Sign    Worried About Running Out of Food in the Last Year: Never true    Ran Out of Food in the Last Year: Never true  Transportation Needs: No Transportation Needs (04/08/2024)   Received from [facility]    In the past 12 months, has lack of reliable transportation kept you from medical appointments, meetings, work or from getting things needed for daily living? : No  Physical Activity: Not on file  Stress: Not on file  Social Connections: Not on file  Intimate Partner Violence: Not on file    Objective   BP 118/75 (BP Location: Right Arm, Patient Position: Sitting, Cuff Size: Large)   Pulse 63   Ht 5\' 6"  (1.676 m)   Wt 257 lb (116.6 kg)   SpO2 94%   BMI 41.48 kg/m   Physical Exam  83 year old female in no acute distress Cardiovascular exam with regular rate and rhythm, systolic murmur appreciated Lungs clear to auscultation bilaterally  Assessment & Plan:   Post-menopausal -     DG Bone Density; Future  Essential hypertension Assessment & Plan: Blood pressure at goal in office today, can continue with current medication regimen, no changes today Recommend intermittent monitoring of blood pressure at home, DASH diet  Orders: -     CBC with Differential/Platelet; Future -     Comprehensive metabolic panel with GFR; Future -     Hemoglobin A1c; Future -     Lipid panel; Future  Vitamin D deficiency Assessment & Plan: Currently taking vitamin D supplement.  We can proceed with recheck vitamin D level today  Orders: -     VITAMIN D 25 Hydroxy  (Vit-D Deficiency, Fractures); Future  Prediabetes Assessment & Plan: Noted in the past with A1c slightly above normal range of 5.7 on abstract results.  This was checked last year.  We can proceed with recheck of this with labs today as part of upcoming physical  Orders: -     Hemoglobin A1c; Future  Fatty liver Assessment & Plan: Noted on prior ultrasound.  She also notes that she was told about cysts within the liver in the past, however has concerns as she has not had a follow-up regarding this previously. Did review imaging results, ultrasound of  liver in 2009 does indicate presence of changes suggestive of fatty liver as well as cysts seen on ultrasound imaging.  Assessment of cyst suggest likely benign cause given appearance on imaging, discussed this with patient.   Stage 3a chronic kidney disease (HCC) Assessment & Plan: She does follow with nephrology, has had stable kidney function recently.  We can proceed with labs today for monitoring.  Will need to be mindful of how this impacts management of other medical conditions   Wellness examination -     CBC with Differential/Platelet; Future -     Comprehensive metabolic panel with GFR; Future -     Hemoglobin A1c; Future -     Lipid panel; Future -     VITAMIN D 25 Hydroxy (Vit-D Deficiency, Fractures); Future  Return in about 1 month (around 05/16/2024) for CPE.    ___________________________________________ Arisbel Maione de [place], MD, ABFM, [facility]

## 2024-04-16 NOTE — Assessment & Plan Note (Signed)
 She does follow with nephrology, has had stable kidney function recently.  We can proceed with labs today for monitoring.  Will need to be mindful of how this impacts management of other medical conditions

## 2024-04-16 NOTE — Assessment & Plan Note (Signed)
 Noted on prior ultrasound.  She also notes that she was told about cysts within the liver in the past, however has concerns as she has not had a follow-up regarding this previously. Did review imaging results, ultrasound of liver in 2009 does indicate presence of changes suggestive of fatty liver as well as cysts seen on ultrasound imaging.  Assessment of cyst suggest likely benign cause given appearance on imaging, discussed this with patient.

## 2024-04-16 NOTE — Assessment & Plan Note (Signed)
Blood pressure at goal in office today, can continue with current medication regimen, no changes today Recommend intermittent monitoring of blood pressure at home, DASH diet

## 2024-04-17 ENCOUNTER — Ambulatory Visit (HOSPITAL_BASED_OUTPATIENT_CLINIC_OR_DEPARTMENT_OTHER): Payer: Self-pay | Admitting: Family Medicine

## 2024-04-17 LAB — CBC WITH DIFFERENTIAL/PLATELET
Basophils Absolute: 0.1 10*3/uL (ref 0.0–0.2)
Basos: 1 %
EOS (ABSOLUTE): 0.2 10*3/uL (ref 0.0–0.4)
Eos: 3 %
Hematocrit: 46.6 % (ref 34.0–46.6)
Hemoglobin: 15 g/dL (ref 11.1–15.9)
Immature Grans (Abs): 0 10*3/uL (ref 0.0–0.1)
Immature Granulocytes: 0 %
Lymphocytes Absolute: 1.6 10*3/uL (ref 0.7–3.1)
Lymphs: 24 %
MCH: 29.5 pg (ref 26.6–33.0)
MCHC: 32.2 g/dL (ref 31.5–35.7)
MCV: 92 fL (ref 79–97)
Monocytes Absolute: 0.9 10*3/uL (ref 0.1–0.9)
Monocytes: 14 %
Neutrophils Absolute: 3.8 10*3/uL (ref 1.4–7.0)
Neutrophils: 58 %
Platelets: 225 10*3/uL (ref 150–450)
RBC: 5.08 x10E6/uL (ref 3.77–5.28)
RDW: 14.1 % (ref 11.7–15.4)
WBC: 6.6 10*3/uL (ref 3.4–10.8)

## 2024-04-17 LAB — COMPREHENSIVE METABOLIC PANEL WITH GFR
ALT: 14 IU/L (ref 0–32)
AST: 17 IU/L (ref 0–40)
Albumin: 4 g/dL (ref 3.7–4.7)
Alkaline Phosphatase: 112 IU/L (ref 44–121)
BUN/Creatinine Ratio: 22 (ref 12–28)
BUN: 26 mg/dL (ref 8–27)
Bilirubin Total: 0.5 mg/dL (ref 0.0–1.2)
CO2: 21 mmol/L (ref 20–29)
Calcium: 10.4 mg/dL — ABNORMAL HIGH (ref 8.7–10.3)
Chloride: 105 mmol/L (ref 96–106)
Creatinine, Ser: 1.2 mg/dL — ABNORMAL HIGH (ref 0.57–1.00)
Globulin, Total: 2.7 g/dL (ref 1.5–4.5)
Glucose: 98 mg/dL (ref 70–99)
Potassium: 4.7 mmol/L (ref 3.5–5.2)
Sodium: 140 mmol/L (ref 134–144)
Total Protein: 6.7 g/dL (ref 6.0–8.5)
eGFR: 45 mL/min/{1.73_m2} — ABNORMAL LOW (ref >59)

## 2024-04-17 LAB — LIPID PANEL
Chol/HDL Ratio: 3.7 ratio (ref 0.0–4.4)
Cholesterol, Total: 177 mg/dL (ref 100–199)
HDL: 48 mg/dL (ref >39)
LDL Chol Calc (NIH): 106 mg/dL — ABNORMAL HIGH (ref 0–99)
Triglycerides: 129 mg/dL (ref 0–149)
VLDL Cholesterol Cal: 23 mg/dL (ref 5–40)

## 2024-04-17 LAB — VITAMIN D 25 HYDROXY (VIT D DEFICIENCY, FRACTURES): Vit D, 25-Hydroxy: 38 ng/mL (ref 30.0–100.0)

## 2024-04-17 LAB — HEMOGLOBIN A1C
Est. average glucose Bld gHb Est-mCnc: 123 mg/dL
Hgb A1c MFr Bld: 5.9 % — ABNORMAL HIGH (ref 4.8–5.6)

## 2024-04-19 NOTE — Telephone Encounter (Signed)
 Requesting office sent duplicate request as to needing clearance to be documented whether low risk, medium risk or high risk.   I will send back to preop APP for review. Pt has been cleared as of 04/10/24 by Charles Connor, NP.

## 2024-04-19 NOTE — Telephone Encounter (Signed)
 Patient is at low risk for major cardiac event with colonoscopy.  Gerldine Koch, NP-C  04/19/2024, 4:09 PM [address] [place], [place] [address] Office [phone] Fax [phone]

## 2024-05-08 ENCOUNTER — Encounter (HOSPITAL_BASED_OUTPATIENT_CLINIC_OR_DEPARTMENT_OTHER): Payer: Self-pay | Admitting: *Deleted

## 2024-05-09 ENCOUNTER — Ambulatory Visit: Payer: Self-pay

## 2024-05-09 NOTE — Telephone Encounter (Signed)
 Copied from CRM [id number]. Topic: Clinical - Red Word Triage >> May 09, 2024  4:17 PM Donee H wrote: [facility] that prompted transfer to Nurse Triage: Patient is stating she has an open sore on her left leg, that has been there a couple of days now. Sore is not healing at all and she has derma on this particular leg which is a concern to her. She wanted to get an appointment with PCP.   FYI Only or Action Required?: FYI only for provider.  Patient was last seen in primary care on 04/16/2024 by de Peru, Quintin PARAS, MD. Called Nurse Triage reporting Leg Sore. Symptoms began 2-3 days ago. Symptoms are: unchanged.  Triage Disposition: Home Care  Patient/caregiver understands and will follow disposition?: No, wishes to speak with PCP   Reason for Disposition  1 or 2 small sores  Answer Assessment - Initial Assessment Questions Patient requesting an appointment to have her sore evaluated. I advised the patient that there are no appointments available until next month. The patient verbalized being unhappy with this. Patient advised that urgent care is available for her to be seen in a timely manner. Patient verbalized understanding and ended the call.       1. APPEARANCE of SORES: What do the sores look like?     Circular sore with some surrounding redness 2. NUMBER: How many sores are there?     One sore  3. SIZE: How big is the largest sore?     1/2 inch wide  4. LOCATION: Where are the sores located?     Left leg  5. ONSET: When did the sores begin?     2-3 days ago  6. TENDER: Does it hurt when you touch it?  (Scale 1-10; or mild, moderate, severe)      No 7. CAUSE: What do you think is causing the sores?     Unsure  8. OTHER SYMPTOMS: Do you have any other symptoms? (e.g., fever, new weakness)     No  Protocols used: Sores-A-AH

## 2024-05-09 NOTE — Telephone Encounter (Signed)
LVM to offer patient sooner appt

## 2024-05-13 ENCOUNTER — Ambulatory Visit
Admission: EM | Admit: 2024-05-13 | Discharge: 2024-05-13 | Disposition: A | Discharge status: Home / Self Care | Attending: Family Medicine | Admitting: Family Medicine

## 2024-05-13 DIAGNOSIS — L03116 Cellulitis of left lower limb: Secondary | ICD-10-CM

## 2024-05-13 MED ORDER — DOXYCYCLINE HYCLATE 100 MG PO CAPS: 100.0000 mg | ORAL_CAPSULE | Freq: Two times a day (BID) | ORAL | 0 refills | Status: AC

## 2024-05-13 NOTE — ED Triage Notes (Addendum)
 Pt presents to uc with co left shin redness and wound. Pt reports son who is a PA told her to use Vaseline and bandage since last week. Pt reports the redness is increasing and she is concerned for infection. No known injury.

## 2024-05-13 NOTE — Discharge Instructions (Addendum)
 Advised patient to take medication as directed with food to completion.  Encouraged to increase daily water intake to 64 ounces per day while taking this medication.  Advised if symptoms worsen and/or unresolved please follow-up with your PCP or here for further evaluation.

## 2024-05-13 NOTE — ED Provider Notes (Signed)
 [facility]    CSN: [id number] Arrival date & time: 05/13/24  1317      History   Chief Complaint Chief Complaint  Patient presents with   Wound Check    HPI [name] is a 83 y.o. female.   HPI pleasant 83 year old female presents with wound of left lower leg and is concerned for infection.  PMH significant for atrial fibrillation, congestive heart failure, and HTN.  Patient is currently on apixaban  and denies any unusual bleeding.  Past Medical History:  Diagnosis Date   A-fib [facility])    Aortic stenosis    Congestive heart failure (CHF) (HCC)    Hypertension     Patient Active Problem List   Diagnosis Date Noted   Chronic diastolic heart failure (HCC) 04/16/2024   Fatty liver 04/16/2024   Hardening of the aorta (main artery of the heart) (HCC) 04/16/2024   Irregular heartbeat 04/16/2024   Morbid obesity (HCC) 04/16/2024   Prediabetes 04/16/2024   Stage 3a chronic kidney disease (HCC) 04/16/2024   Thoracic aortic ectasia (HCC) 04/16/2024   Urinary incontinence 04/16/2024   Vitamin D  deficiency 04/16/2024   History of colon polyps 04/08/2024   Essential hypertension 02/01/2024   Cervical spine arthritis 12/02/2022   Trigger index finger of right hand 12/02/2022   Sebaceous cyst of labia 06/08/2020   History of total knee arthroplasty 04/20/2018   Osteoarthritis 03/09/2018   Endometrial polyp 07/19/2016   Vaginal itching 07/19/2016   Morbid obesity with BMI of 40.0-44.9, adult (HCC) 06/22/2016   Postmenopausal bleeding 05/25/2016    Past Surgical History:  Procedure Laterality Date   BILATERAL CARPAL TUNNEL RELEASE     DILATION AND CURETTAGE OF UTERUS     TOTAL KNEE ARTHROPLASTY Bilateral     OB History   No obstetric history on file.      Home Medications    Prior to Admission medications   Medication Sig Start Date End Date Taking? Authorizing Provider  doxycycline  (VIBRAMYCIN ) 100 MG capsule Take 1 capsule (100 mg total) by  mouth 2 (two) times daily for 7 days. 05/13/24 05/20/24 Yes Teddy Sharper, FNP  spironolactone  (ALDACTONE ) 25 MG tablet Take 1 tablet (25 mg total) by mouth daily. 03/15/24  Yes Cleaver, Josefa HERO, NP  apixaban  (ELIQUIS ) 5 MG TABS tablet Take 1 tablet (5 mg total) by mouth 2 (two) times daily. 02/01/24   Emelia Josefa HERO, NP  Ascorbic Acid (VITAMIN C PO) Take by mouth daily.    [provider]  Cholecalciferol (VITAMIN D3) 50 MCG (2000 UT) CAPS Take 2,000 Units by mouth every morning.    [provider]  Coenzyme Q10 (CO Q-10) 200 MG CAPS Take by mouth.    [provider]  dapagliflozin  propanediol (FARXIGA ) 10 MG TABS tablet Take 1 tablet (10 mg total) by mouth daily before breakfast. 03/25/24   Pietro Redell RAMAN, MD  Magnesium 300 MG CAPS Take 600 mg by mouth at bedtime.    [provider]  metoprolol  tartrate (LOPRESSOR ) 50 MG tablet Take 1.5 tablets (75 mg total) by mouth 2 (two) times daily. 02/01/24   Emelia Josefa HERO, NP  POTASSIUM CITRATE PO Take by mouth at bedtime.    [provider]  torsemide  (DEMADEX ) 20 MG tablet Increase dose to 40 mg in the morning and keep dose at 20 mg in the afternoon. 09/04/23   Pietro Redell RAMAN, MD  Turmeric (QC TUMERIC COMPLEX PO) Take by mouth.    [provider]  vitamin B-12 (CYANOCOBALAMIN) 500 MCG tablet Take 500 mcg by mouth daily.    [provider]    Family History Family History  Problem Relation Age of Onset   Angina Mother     Social History Social History   Tobacco Use   Smoking status: Never    Passive exposure: Never   Smokeless tobacco: Never  Vaping Use   Vaping status: Never Used  Substance Use Topics   Alcohol use: Yes    Comment: rare   Drug use: Never     Allergies   Corticosteroids, Gluten meal, and Methylprednisolone  sodium succ   Review of Systems Review of Systems   Physical Exam Triage Vital Signs ED Triage Vitals [05/13/24 1341]  Encounter Vitals  Group     BP (!) 163/84     Girls Systolic BP Percentile      Girls Diastolic BP Percentile      Boys Systolic BP Percentile      Boys Diastolic BP Percentile      Pulse Rate (!) 102     Resp (!) 102     Temp 98.3 F (36.8 C)     Temp src      SpO2 93 %     Weight      Height      Head Circumference      Peak Flow      Pain Score      Pain Loc      Pain Education      Exclude from Growth Chart    No data found.  Updated Vital Signs BP (!) 163/84   Pulse (!) 102   Temp 98.3 F (36.8 C)   Resp (!) 102   SpO2 93%    Physical Exam Vitals and nursing note reviewed.  Constitutional:      Appearance: Normal appearance. She is normal weight.  HENT:     Head: Normocephalic and atraumatic.     Mouth/Throat:     Mouth: Mucous membranes are moist.     Pharynx: Oropharynx is clear.  Eyes:     Extraocular Movements: Extraocular movements intact.     Conjunctiva/sclera: Conjunctivae normal.     Pupils: Pupils are equal, round, and reactive to light.  Cardiovascular:     Pulses: Normal pulses.     Heart sounds: Normal heart sounds.  Pulmonary:     Effort: Pulmonary effort is normal.     Breath sounds: Normal breath sounds. No wheezing, rhonchi or rales.  Musculoskeletal:        General: Normal range of motion.  Skin:    General: Skin is warm and dry.     Comments: Left lower leg (anterior inferior aspect): ~0.5 cm x 0.5 cm circular shaped area of erythema, induration please see image below  Neurological:     General: No focal deficit present.     Mental Status: She is alert and oriented to person, place, and time. Mental status is at baseline.  Psychiatric:        Mood and Affect: Mood normal.        Behavior: Behavior normal.      UC Treatments / Results  Labs (all labs ordered are listed, but only abnormal results are displayed) Labs Reviewed - No data to display  EKG   Radiology No results found.  Procedures Procedures (including critical care  time)  Medications Ordered in UC Medications - No data to display  Initial Impression / Assessment and Plan /  UC Course  I have reviewed the triage vital signs and the nursing notes.  Pertinent labs & imaging results that were available during my care of the patient were reviewed by me and considered in my medical decision making (see chart for details).     MDM: 1.  Cellulitis of the left anterior lower leg-Rx'd doxycycline  100 mg capsule: Take 1 capsule twice daily x 7 days. Advised patient to take medication as directed with food to completion.  Encouraged to increase daily water intake to 64 ounces per day while taking this medication.  Advised if symptoms worsen and/or unresolved please follow-up with your PCP or here for further evaluation.  Patient discharged home, hemodynamically stable. Final Clinical Impressions(s) / UC Diagnoses   Final diagnoses:  Cellulitis of left anterior lower leg     Discharge Instructions      Advised patient to take medication as directed with food to completion.  Encouraged to increase daily water intake to 64 ounces per day while taking this medication.  Advised if symptoms worsen and/or unresolved please follow-up with your PCP or here for further evaluation.     ED Prescriptions     Medication Sig Dispense Auth. Provider   doxycycline  (VIBRAMYCIN ) 100 MG capsule Take 1 capsule (100 mg total) by mouth 2 (two) times daily for 7 days. 14 capsule Sapir Lavey, FNP      PDMP not reviewed this encounter.   Teddy Sharper, FNP 05/13/24 [phone]

## 2024-05-20 ENCOUNTER — Other Ambulatory Visit: Payer: Self-pay

## 2024-05-20 ENCOUNTER — Ambulatory Visit
Admission: EM | Admit: 2024-05-20 | Discharge: 2024-05-20 | Disposition: A | Discharge status: Home / Self Care | Attending: Family Medicine | Admitting: Family Medicine

## 2024-05-20 DIAGNOSIS — Z5189 Encounter for other specified aftercare: Secondary | ICD-10-CM | POA: Diagnosis not present

## 2024-05-20 NOTE — ED Triage Notes (Signed)
 Here for f/u lle wound. Is looking better, is no longer open and has scab now. Has finished doxycycline .

## 2024-05-20 NOTE — Discharge Instructions (Signed)
 You're wound is healing well You may go back in the pool

## 2024-05-20 NOTE — ED Provider Notes (Signed)
 [name]    CSN: [id number] Arrival date & time: 05/20/24  1250      History   Chief Complaint Chief Complaint  Patient presents with   Wound Check    HPI [name] is a 83 y.o. female.   HPI Patient was seen a week ago for a wound with cellulitis.  She was given a week of doxycycline .  She has taken this 2 times a day for a week and is here for follow-up.  Her main form of exercise is swimming in a pool.  She was told not to go back in the pool until her wound had healed.  She wonders whether her wound has healed sufficiently that she can resume her pool exercise  Past Medical History:  Diagnosis Date   A-fib [facility])    Aortic stenosis    Congestive heart failure (CHF) (HCC)    Hypertension     Patient Active Problem List   Diagnosis Date Noted   Chronic diastolic heart failure (HCC) 04/16/2024   Fatty liver 04/16/2024   Hardening of the aorta (main artery of the heart) (HCC) 04/16/2024   Irregular heartbeat 04/16/2024   Morbid obesity (HCC) 04/16/2024   Prediabetes 04/16/2024   Stage 3a chronic kidney disease (HCC) 04/16/2024   Thoracic aortic ectasia (HCC) 04/16/2024   Urinary incontinence 04/16/2024   Vitamin D  deficiency 04/16/2024   History of colon polyps 04/08/2024   Essential hypertension 02/01/2024   Cervical spine arthritis 12/02/2022   Trigger index finger of right hand 12/02/2022   Sebaceous cyst of labia 06/08/2020   History of total knee arthroplasty 04/20/2018   Osteoarthritis 03/09/2018   Endometrial polyp 07/19/2016   Vaginal itching 07/19/2016   Morbid obesity with BMI of 40.0-44.9, adult (HCC) 06/22/2016   Postmenopausal bleeding 05/25/2016    Past Surgical History:  Procedure Laterality Date   BILATERAL CARPAL TUNNEL RELEASE     DILATION AND CURETTAGE OF UTERUS     TOTAL KNEE ARTHROPLASTY Bilateral     OB History   No obstetric history on file.      Home Medications    Prior to Admission medications    Medication Sig Start Date End Date Taking? Authorizing Provider  apixaban  (ELIQUIS ) 5 MG TABS tablet Take 1 tablet (5 mg total) by mouth 2 (two) times daily. 02/01/24   Emelia Josefa HERO, NP  Ascorbic Acid (VITAMIN C PO) Take by mouth daily.    [provider]  Cholecalciferol (VITAMIN D3) 50 MCG (2000 UT) CAPS Take 2,000 Units by mouth every morning.    [provider]  Coenzyme Q10 (CO Q-10) 200 MG CAPS Take by mouth.    [provider]  dapagliflozin  propanediol (FARXIGA ) 10 MG TABS tablet Take 1 tablet (10 mg total) by mouth daily before breakfast. 03/25/24   Pietro, Redell RAMAN, MD  doxycycline  (VIBRAMYCIN ) 100 MG capsule Take 1 capsule (100 mg total) by mouth 2 (two) times daily for 7 days. 05/13/24 05/20/24  Ragan, Michael, FNP  Magnesium 300 MG CAPS Take 600 mg by mouth at bedtime.    [provider]  metoprolol  tartrate (LOPRESSOR ) 50 MG tablet Take 1.5 tablets (75 mg total) by mouth 2 (two) times daily. 02/01/24   Emelia Josefa HERO, NP  POTASSIUM CITRATE PO Take by mouth at bedtime.    [provider]  spironolactone  (ALDACTONE ) 25 MG tablet Take 1 tablet (25 mg total) by mouth daily. 03/15/24   Emelia Josefa HERO, NP  torsemide  (DEMADEX )  20 MG tablet Increase dose to 40 mg in the morning and keep dose at 20 mg in the afternoon. 09/04/23   Pietro Redell RAMAN, MD  Turmeric (QC TUMERIC COMPLEX PO) Take by mouth.    [provider]  vitamin B-12 (CYANOCOBALAMIN) 500 MCG tablet Take 500 mcg by mouth daily.    [provider]    Family History Family History  Problem Relation Age of Onset   Angina Mother     Social History Social History   Tobacco Use   Smoking status: Never    Passive exposure: Never   Smokeless tobacco: Never  Vaping Use   Vaping status: Never Used  Substance Use Topics   Alcohol use: Yes    Comment: rare   Drug use: Never     Allergies   Corticosteroids, Gluten meal, and Methylprednisolone  sodium  succ   Review of Systems Review of Systems See HPI  Physical Exam Triage Vital Signs ED Triage Vitals  Encounter Vitals Group     BP 05/20/24 1253 137/80     Girls Systolic BP Percentile --      Girls Diastolic BP Percentile --      Boys Systolic BP Percentile --      Boys Diastolic BP Percentile --      Pulse Rate 05/20/24 1253 82     Resp 05/20/24 1253 20     Temp 05/20/24 1253 97.6 F (36.4 C)     Temp src --      SpO2 05/20/24 1253 94 %     Weight --      Height --      Head Circumference --      Peak Flow --      Pain Score 05/20/24 1256 0     Pain Loc --      Pain Education --      Exclude from Growth Chart --    No data found.  Updated Vital Signs BP 137/80   Pulse 82   Temp 97.6 F (36.4 C)   Resp 20   SpO2 94%      Physical Exam Constitutional:      General: She is not in acute distress.    Appearance: She is well-developed. She is obese.  HENT:     Head: Normocephalic and atraumatic.  Eyes:     Conjunctiva/sclera: Conjunctivae normal.     Pupils: Pupils are equal, round, and reactive to light.  Cardiovascular:     Rate and Rhythm: Normal rate.  Pulmonary:     Effort: Pulmonary effort is normal. No respiratory distress.  Abdominal:     General: There is no distension.     Palpations: Abdomen is soft.  Musculoskeletal:        General: Normal range of motion.     Cervical back: Normal range of motion.     Right lower leg: Edema present.     Left lower leg: Edema present.  Skin:    General: Skin is warm and dry.     Comments: On the anterior portion of the left mid shin there is a 1 cm eschar that appears to be superficial.  There is no surrounding tenderness or erythema  Neurological:     Mental Status: She is alert.      UC Treatments / Results  Labs (all labs ordered are listed, but only abnormal results are displayed) Labs Reviewed - No data to display  EKG   Radiology No results found.  Procedures Procedures (including  critical Rodriguez time)  Medications Ordered in UC Medications - No data to display  Initial Impression / Assessment and Plan / UC Course  I have reviewed the triage vital signs and the nursing notes.  Pertinent labs & imaging results that were available during my Rodriguez of the patient were reviewed by me and considered in my medical decision making (see chart for details).     Final Clinical Impressions(s) / UC Diagnoses   Final diagnoses:  Visit for wound check     Discharge Instructions      You're wound is healing well You may go back in the pool      ED Prescriptions   None    PDMP not reviewed this encounter.   Maranda Jamee Jacob, MD 05/20/24 [phone]

## 2024-05-22 ENCOUNTER — Other Ambulatory Visit: Payer: Self-pay | Admitting: General Practice

## 2024-05-30 ENCOUNTER — Ambulatory Visit
Admission: EM | Admit: 2024-05-30 | Discharge: 2024-05-30 | Disposition: A | Discharge status: Home / Self Care | Attending: Family Medicine | Admitting: Family Medicine

## 2024-05-30 ENCOUNTER — Encounter: Payer: Self-pay | Admitting: Emergency Medicine

## 2024-05-30 DIAGNOSIS — R319 Hematuria, unspecified: Secondary | ICD-10-CM | POA: Insufficient documentation

## 2024-05-30 DIAGNOSIS — N390 Urinary tract infection, site not specified: Secondary | ICD-10-CM | POA: Insufficient documentation

## 2024-05-30 DIAGNOSIS — R102 Pelvic and perineal pain: Secondary | ICD-10-CM | POA: Insufficient documentation

## 2024-05-30 LAB — POCT URINALYSIS DIP (MANUAL ENTRY)
Bilirubin, UA: NEGATIVE
Glucose, UA: >=1000 mg/dL — AB
Ketones, POC UA: NEGATIVE mg/dL
Leukocytes, UA: NEGATIVE
Nitrite, UA: POSITIVE — AB
Protein Ur, POC: NEGATIVE mg/dL
Spec Grav, UA: 1.015 (ref 1.010–1.025)
Urobilinogen, UA: 0.2 U/dL
pH, UA: 6.5 (ref 5.0–8.0)

## 2024-05-30 MED ORDER — FLUCONAZOLE 150 MG PO TABS: ORAL_TABLET | ORAL | 0 refills | Status: DC

## 2024-05-30 MED ORDER — NITROFURANTOIN MONOHYD MACRO 100 MG PO CAPS
100.0000 mg | ORAL_CAPSULE | Freq: Two times a day (BID) | ORAL | 0 refills | Status: AC
Start: 2024-05-30 — End: 2024-06-06

## 2024-05-30 NOTE — ED Triage Notes (Signed)
 Patient c/o possible UTI, a lot of abdominal pressure x 2 days.  There was some slight hematuria in her urine a couple of days ago.

## 2024-05-30 NOTE — Discharge Instructions (Addendum)
 UTI with some microscopic hematuria: Nitrofurantoin , 100 mg, 1 pill twice daily for 7 days for acute UTI.  Get plenty of fluids and rest.  Encouraged cranberry juice or lemonade.  Encouraged probiotics while on the antibiotics to prevent antibiotic related diarrhea.  Provided fluconazole , 150 mg, 1 pill now and repeat in 5 to 7 days.  This is only needed if the patient has vaginal itch and irritation that indicate a vaginal yeast infection.  Follow-up if symptoms do not improve, worsen or new symptoms occur.

## 2024-05-30 NOTE — ED Provider Notes (Signed)
 [facility]    CSN: [id number] Arrival date & time: 05/30/24  1211      History   Chief Complaint Chief Complaint  Patient presents with   Abdominal Pressure    HPI [name] is a 83 y.o. female.   83 year old female with report of lower abdominal pain and pressure with some urinary urgency since 05/28/2024.  She reports she is on Farxiga  and has to be very careful about hygiene and yeast infections.  She reports previous UTIs in the last year have occurred quickly but with little or no symptoms.  She was on doxycycline  recently for cellulitis of her left lower leg.  She has been off antibiotics for over a week and denies any vaginal itch nor vaginal irritation.  She denies fever, nausea, vomiting, diarrhea, constipation.     Past Medical History:  Diagnosis Date   A-fib [facility])    Aortic stenosis    Congestive heart failure (CHF) (HCC)    Hypertension     Patient Active Problem List   Diagnosis Date Noted   Chronic diastolic heart failure (HCC) 04/16/2024   Fatty liver 04/16/2024   Hardening of the aorta (main artery of the heart) (HCC) 04/16/2024   Irregular heartbeat 04/16/2024   Morbid obesity (HCC) 04/16/2024   Prediabetes 04/16/2024   Stage 3a chronic kidney disease (HCC) 04/16/2024   Thoracic aortic ectasia (HCC) 04/16/2024   Urinary incontinence 04/16/2024   Vitamin D  deficiency 04/16/2024   History of colon polyps 04/08/2024   Essential hypertension 02/01/2024   Cervical spine arthritis 12/02/2022   Trigger index finger of right hand 12/02/2022   Sebaceous cyst of labia 06/08/2020   History of total knee arthroplasty 04/20/2018   Osteoarthritis 03/09/2018   Endometrial polyp 07/19/2016   Vaginal itching 07/19/2016   Morbid obesity with BMI of 40.0-44.9, adult (HCC) 06/22/2016   Postmenopausal bleeding 05/25/2016    Past Surgical History:  Procedure Laterality Date   BILATERAL CARPAL TUNNEL RELEASE     DILATION AND CURETTAGE OF  UTERUS     TOTAL KNEE ARTHROPLASTY Bilateral     OB History   No obstetric history on file.      Home Medications    Prior to Admission medications   Medication Sig Start Date End Date Taking? Authorizing Provider  apixaban  (ELIQUIS ) 5 MG TABS tablet Take 1 tablet (5 mg total) by mouth 2 (two) times daily. 02/01/24  Yes Cleaver, Josefa HERO, NP  Ascorbic Acid (VITAMIN C PO) Take by mouth daily.   Yes [provider]  Cholecalciferol (VITAMIN D3) 50 MCG (2000 UT) CAPS Take 2,000 Units by mouth every morning.   Yes [provider]  Coenzyme Q10 (CO Q-10) 200 MG CAPS Take by mouth.   Yes [provider]  dapagliflozin  propanediol (FARXIGA ) 10 MG TABS tablet Take 1 tablet (10 mg total) by mouth daily before breakfast. 03/25/24  Yes Crenshaw, Redell RAMAN, MD  fluconazole  (DIFLUCAN ) 150 MG tablet By mouth today and repeat in 5-7 days for vaginal yeast infection. 05/30/24  Yes Ival Domino, FNP  Magnesium 300 MG CAPS Take 600 mg by mouth at bedtime.   Yes [provider]  metoprolol  tartrate (LOPRESSOR ) 50 MG tablet Take 1.5 tablets (75 mg total) by mouth 2 (two) times daily. 05/23/24  Yes Pietro Redell RAMAN, MD  nitrofurantoin , macrocrystal-monohydrate, (MACROBID ) 100 MG capsule Take 1 capsule (100 mg total) by mouth 2 (two) times daily for 7 days. 05/30/24 06/06/24 Yes Ival Domino, FNP  POTASSIUM CITRATE PO Take by mouth at bedtime.   Yes [provider]  spironolactone  (ALDACTONE ) 25 MG tablet Take 1 tablet (25 mg total) by mouth daily. 03/15/24  Yes Cleaver, Josefa HERO, NP  torsemide  (DEMADEX ) 20 MG tablet Increase dose to 40 mg in the morning and keep dose at 20 mg in the afternoon. 09/04/23  Yes Crenshaw, Redell RAMAN, MD  Turmeric (QC TUMERIC COMPLEX PO) Take by mouth.   Yes [provider]  vitamin B-12 (CYANOCOBALAMIN) 500 MCG tablet Take 500 mcg by mouth daily.   Yes [provider]    Family History Family History  Problem Relation Age of  Onset   Angina Mother     Social History Social History   Tobacco Use   Smoking status: Never    Passive exposure: Never   Smokeless tobacco: Never  Vaping Use   Vaping status: Never Used  Substance Use Topics   Alcohol use: Yes    Comment: rare   Drug use: Never     Allergies   Corticosteroids, Gluten meal, and Methylprednisolone  sodium succ   Review of Systems Review of Systems  Constitutional:  Negative for fever.  Respiratory:  Negative for cough.   Cardiovascular:  Negative for chest pain.  Gastrointestinal:  Positive for abdominal pain. Negative for constipation, diarrhea, nausea and vomiting.  Genitourinary:  Positive for frequency.  Musculoskeletal:  Negative for arthralgias and back pain.  Skin:  Negative for color change and rash.  Neurological:  Negative for syncope.  All other systems reviewed and are negative.    Physical Exam Triage Vital Signs ED Triage Vitals  Encounter Vitals Group     BP 05/30/24 1227 (!) 142/80     Girls Systolic BP Percentile --      Girls Diastolic BP Percentile --      Boys Systolic BP Percentile --      Boys Diastolic BP Percentile --      Pulse Rate 05/30/24 1227 88     Resp 05/30/24 1227 18     Temp 05/30/24 1227 97.8 F (36.6 C)     Temp Source 05/30/24 1227 Oral     SpO2 05/30/24 1227 96 %     Weight 05/30/24 1229 245 lb (111.1 kg)     Height 05/30/24 1229 5' 6 (1.676 m)     Head Circumference --      Peak Flow --      Pain Score 05/30/24 1229 0     Pain Loc --      Pain Education --      Exclude from Growth Chart --    No data found.  Updated Vital Signs BP (!) 142/80 (BP Location: Right Arm)   Pulse 88   Temp 97.8 F (36.6 C) (Oral)   Resp 18   Ht 5' 6 (1.676 m)   Wt 245 lb (111.1 kg)   SpO2 96%   BMI 39.54 kg/m   Visual Acuity Right Eye Distance:   Left Eye Distance:   Bilateral Distance:    Right Eye Near:   Left Eye Near:    Bilateral Near:     Physical Exam Vitals and nursing note  reviewed.  Constitutional:      General: She is not in acute distress.    Appearance: She is well-developed. She is not ill-appearing or toxic-appearing.  HENT:     Head: Normocephalic and atraumatic.     Right Ear: External ear normal.  Left Ear: External ear normal.     Nose: Nose normal.     Mouth/Throat:     Lips: Pink.     Mouth: Mucous membranes are moist.  Eyes:     Conjunctiva/sclera: Conjunctivae normal.     Pupils: Pupils are equal, round, and reactive to light.  Cardiovascular:     Rate and Rhythm: Normal rate and regular rhythm.     Heart sounds: S1 normal and S2 normal. No murmur heard. Pulmonary:     Effort: Pulmonary effort is normal. No respiratory distress.     Breath sounds: Normal breath sounds. No decreased breath sounds, wheezing, rhonchi or rales.  Abdominal:     Tenderness: There is abdominal tenderness (Mild) in the right lower quadrant, suprapubic area and left lower quadrant. There is no right CVA tenderness, left CVA tenderness, guarding or rebound. Negative signs include Murphy's sign, Rovsing's sign and McBurney's sign.  Musculoskeletal:        General: No swelling.  Skin:    General: Skin is warm and dry.     Capillary Refill: Capillary refill takes less than 2 seconds.     Findings: No rash.  Neurological:     Mental Status: She is alert and oriented to person, place, and time.  Psychiatric:        Mood and Affect: Mood normal.      UC Treatments / Results  Labs (all labs ordered are listed, but only abnormal results are displayed) Labs Reviewed  POCT URINALYSIS DIP (MANUAL ENTRY) - Abnormal; Notable for the following components:      Result Value   Glucose, UA >=1,000 (*)    Blood, UA trace-intact (*)    Nitrite, UA Positive (*)    All other components within normal limits  URINE CULTURE    EKG   Radiology No results found.  Procedures Procedures (including critical care time)  Medications Ordered in UC Medications - No  data to display  Initial Impression / Assessment and Plan / UC Course  I have reviewed the triage vital signs and the nursing notes.  Pertinent labs & imaging results that were available during my care of the patient were reviewed by me and considered in my medical decision making (see chart for details).  Plan of Care: UTI with suprapubic pain: UA is abnormal.  Culture sent.  Nitrofurantoin , 100 mg 1 pill twice daily for 7 days.  Get plenty of fluids and rest.  Fluconazole  150 mg 1 pill now and repeat in 5 to 7 days, if needed for vaginal yeast infection.  Encouraged use of probiotics or eating yogurt daily while on antibiotics and due to recent use of doxycycline .  Follow-up if symptoms do not improve, worsen or new symptoms occur.  I reviewed the plan of care with the patient and/or the patient's guardian.  The patient and/or guardian had time to ask questions and acknowledged that the questions were answered.  I provided instruction on symptoms or reasons to return here or to go to an ER, if symptoms/condition did not improve, worsened or if new symptoms occurred.  Final Clinical Impressions(s) / UC Diagnoses   Final diagnoses:  Acute suprapubic pain  Urinary tract infection with hematuria, site unspecified     Discharge Instructions      UTI with some microscopic hematuria: Nitrofurantoin , 100 mg, 1 pill twice daily for 7 days for acute UTI.  Get plenty of fluids and rest.  Encouraged cranberry juice or lemonade.  Encouraged probiotics while  on the antibiotics to prevent antibiotic related diarrhea.  Provided fluconazole , 150 mg, 1 pill now and repeat in 5 to 7 days.  This is only needed if the patient has vaginal itch and irritation that indicate a vaginal yeast infection.  Follow-up if symptoms do not improve, worsen or new symptoms occur.     ED Prescriptions     Medication Sig Dispense Auth. Provider   nitrofurantoin , macrocrystal-monohydrate, (MACROBID ) 100 MG capsule  Take 1 capsule (100 mg total) by mouth 2 (two) times daily for 7 days. 14 capsule Ival Domino, FNP   fluconazole  (DIFLUCAN ) 150 MG tablet By mouth today and repeat in 5-7 days for vaginal yeast infection. 2 tablet Danyela Posas, FNP      PDMP not reviewed this encounter.   Ival Domino, FNP 05/30/24 1347

## 2024-05-31 ENCOUNTER — Ambulatory Visit (HOSPITAL_BASED_OUTPATIENT_CLINIC_OR_DEPARTMENT_OTHER): Payer: Self-pay | Admitting: Family Medicine

## 2024-06-01 NOTE — Progress Notes (Signed)
 Culture is positive.  Patient is on nitrofurantoin  and it has an intermediate response to nitrofurantoin .  Patient reports she feels great like the antibiotic is working well.  She was advised of her results and will contact our office if she starts to feel like the antibiotic has not been helpful after all.  At that time we could stop the nitrofurantoin  and put her on Cipro or an alternative antibiotic.

## 2024-06-02 DIAGNOSIS — I130 Hypertensive heart and chronic kidney disease with heart failure and stage 1 through stage 4 chronic kidney disease, or unspecified chronic kidney disease: Secondary | ICD-10-CM | POA: Diagnosis not present

## 2024-06-02 DIAGNOSIS — Z6841 Body Mass Index (BMI) 40.0 and over, adult: Secondary | ICD-10-CM | POA: Diagnosis not present

## 2024-06-02 DIAGNOSIS — I4891 Unspecified atrial fibrillation: Secondary | ICD-10-CM | POA: Diagnosis not present

## 2024-06-02 DIAGNOSIS — D6869 Other thrombophilia: Secondary | ICD-10-CM | POA: Diagnosis not present

## 2024-06-02 DIAGNOSIS — I5032 Chronic diastolic (congestive) heart failure: Secondary | ICD-10-CM | POA: Diagnosis not present

## 2024-06-02 DIAGNOSIS — K5900 Constipation, unspecified: Secondary | ICD-10-CM | POA: Diagnosis not present

## 2024-06-02 DIAGNOSIS — R32 Unspecified urinary incontinence: Secondary | ICD-10-CM | POA: Diagnosis not present

## 2024-06-02 DIAGNOSIS — E261 Secondary hyperaldosteronism: Secondary | ICD-10-CM | POA: Diagnosis not present

## 2024-06-02 DIAGNOSIS — I700 Atherosclerosis of aorta: Secondary | ICD-10-CM | POA: Diagnosis not present

## 2024-06-02 DIAGNOSIS — N1832 Chronic kidney disease, stage 3b: Secondary | ICD-10-CM | POA: Diagnosis not present

## 2024-06-03 LAB — URINE CULTURE: Culture: >=100000 — AB

## 2024-06-04 DIAGNOSIS — N950 Postmenopausal bleeding: Secondary | ICD-10-CM | POA: Diagnosis not present

## 2024-06-04 DIAGNOSIS — N939 Abnormal uterine and vaginal bleeding, unspecified: Secondary | ICD-10-CM | POA: Diagnosis not present

## 2024-06-20 DIAGNOSIS — B351 Tinea unguium: Secondary | ICD-10-CM | POA: Diagnosis not present

## 2024-06-26 ENCOUNTER — Telehealth: Payer: Self-pay

## 2024-06-26 NOTE — Telephone Encounter (Signed)
   Name: [name]  DOB: 03-03-1941  MRN: [id number]  Primary Cardiologist: Redell Shallow, MD  Chart reviewed as part of pre-operative protocol coverage. The patient has an upcoming visit scheduled with Orren Fabry, PA,  on 07/02/2024 at which time clearance can be addressed in case there are any issues that would impact surgical recommendations.  HYSTEROSCOPIC POLYPECTOMY Is not scheduled until TBD as below. I added preop FYI to appointment note so that provider is aware to address at time of outpatient visit.  Per office protocol the cardiology provider should forward their finalized clearance decision and recommendations regarding antiplatelet therapy to the requesting party below.    This message will also be routed to pharmacy pool as high priority for input on holding Eliquis  as requested below so that this information is available to the clearing provider at time of patient's appointment.   I will route this message as FYI to requesting party and remove this message from the preop box as separate preop APP input not needed at this time.   Please call with any questions.  Lamarr Satterfield, NP  06/26/2024, 4:29 PM

## 2024-06-26 NOTE — Telephone Encounter (Signed)
   Pre-operative Risk Assessment    Patient Name: [name]  DOB: 1941/10/31 MRN: [id number]   Date of last office visit: 03/15/24 Kellie BEAUVAIS, NP Date of next office visit: 07/02/24 Kellie FABRY, PA-C   Request for Surgical Clearance    Procedure:  HYSTEROSCOPIC POLYPECTOMY  Date of Surgery:  Clearance TBD                                Surgeon:  NOT INDICATED Surgeon's Group or Practice Name:  [facility] Phone number:  [phone] Fax number:  [phone]   Type of Clearance Requested:   - Medical  - Pharmacy:  Hold Apixaban  (Eliquis )     Type of Anesthesia:  General    Additional requests/questions:    SignedLucie DELENA Rodriguez   06/26/2024, 4:06 PM

## 2024-06-26 NOTE — Telephone Encounter (Signed)
 Pt has IN OFFICE appt with 07/02/24 with Orren Fabry, PA-C. Will make sure that the appt notes are updated to show that pt needs preop clearance.  Will update the surgeons office.

## 2024-06-26 NOTE — Telephone Encounter (Signed)
 Pharmacy please advise on holding Eliquis  prior to HYSTEROSCOPIC POLYPECTOMY  scheduled for TBD. Has appt scheduled on 07/02/2024 with Orren Fabry, PA, last labs were on 06/04/2024 (CBC, CMET).  Thank you.

## 2024-06-27 DIAGNOSIS — R202 Paresthesia of skin: Secondary | ICD-10-CM | POA: Insufficient documentation

## 2024-06-27 DIAGNOSIS — I499 Cardiac arrhythmia, unspecified: Secondary | ICD-10-CM | POA: Insufficient documentation

## 2024-06-27 NOTE — Telephone Encounter (Signed)
 Patient with diagnosis of A fib on Eliquis  for anticoagulation.    Procedure: HYSTEROSCOPIC POLYPECTOMY  Date of procedure: TBD   CHA2DS2-VASc Score = 6  his indicates a 9.7% annual risk of stroke. The patient's score is based upon: CHF History: 1 HTN History: 1 Diabetes History: 0 Stroke History: 0 Vascular Disease History: 1 Age Score: 2 Gender Score: 1     CrCl 81 ml/min Platelet count 207K  Patient has not had an Afib/aflutter ablation within the last 3 months or DCCV within the last 30 days   Per office protocol, patient can hold Eliquis  for 2 days prior to procedure.    **This guidance is not considered finalized until pre-operative APP has relayed final recommendations.**

## 2024-06-27 NOTE — Addendum Note (Signed)
 Addended by: DARRELL BRUCKNER on: 06/27/2024 11:57 AM   Modules accepted: Orders

## 2024-07-01 NOTE — Progress Notes (Unsigned)
 Cardiology Office Note   Date:  07/02/2024  ID:  Francia, Verry May 13, 1941, MRN [id number] PCP: de [place], Raymond J, MD   HeartCare Providers Cardiologist:  Redell Shallow, MD   History of Present Illness CASSIE SHEDLOCK is a 83 y.o. female with a history of an accelerated heart rate, chronic diastolic CHF, aortic valve stenosis, exertional dyspnea, abnormal kidney function test and hypertension here for follow-up appointment.  Previously she has refused all medications.  Was seen in [place]  7/22 with dyspnea and lower extremity swelling.  Blood pressure by report was 192/93.  Underwent a nuclear stress test 3/23 which showed LVEF 58% and no ischemia.  Echocardiogram 10/24 showed normal LV function, moderate LVH, G1 DD, mild to moderate left atrial enlargement, calcified aorta with moderate stenosis and mean gradient of 16.2 mmHg.  Was seen by Dr. Shallow 01/24/2024.  During that time she noted some dyspnea which was unchanged.  She denied orthopnea and PND.  She had chronic pedal swelling.  Denied chest pain and syncope.  Did stop taking her diuretics 1.5 weeks prior to her last visit.  She felt this was causing kidney issues.  It was explained to her that breathing would get worse when she is not taking her medication.  She was scheduled to see nephrology the following day.  Creatinine was 1.02, GFR 55 and all her other lab values were within normal limits.  She presented to the clinic on 02/01/2024 for evaluation and stated that her Apple Watch had been giving her alerts for possible atrial fibrillation.  EKG showed atrial fibrillation 92 bpm.  Reviewed the risks of atrial fibrillation at that time and she expressed understanding.  She reported she had been at her nephrologist's office who encouraged her to restart torsemide .  The importance of potassium supplementation was also discussed.  She then had her metoprolol  increased and apixaban  5 mg twice daily was started.  Plan was  to return in 1 month to discuss DCCV.  He was last seen April 13 and continue to monitor heart rate.  She wanted patient assistance forms for her pharmacy and Eliquis .  She also ran out of her spironolactone  and had been out of it for about a month.  At that visit she denied chest pain, shortness of breath, palpitations, melena, hematuria, hemoptysis, diaphoresis, weakness, presyncope/syncope, orthopnea, and PND.  Today, she presents with a hx of atrial fibrillation for cardiovascular follow-up and preop clearance.  She has not experienced any new cardiac issues since her last visit and has not had episodes of atrial fibrillation since April. She is currently taking Eliquis  and metoprolol , 75 mg twice daily.  She experiences shortness of breath with activity, which has been stable. She can walk around a store with the support of a cart but becomes breathless when walking short distances without support. She avoids stairs and does not engage in yard work but swims daily for exercise.  She has a heart murmur since childhood and has undergone several echocardiograms in the past. Her sister also has a heart murmur, suggesting a possible hereditary component.  She has a right bundle branch block, first noted in May and again in August, which is not associated with her heart murmur.  Reports no shortness of breath nor dyspnea on exertion. Reports no chest pain, pressure, or tightness. No edema, orthopnea, PND. Reports no palpitations.   Discussed the use of AI scribe software for clinical note transcription with the patient, who gave verbal consent to proceed.  ROS: pertinent ROS in HPI  Studies Reviewed EKG Interpretation Date/Time:  Tuesday July 02 2024 07:55:04 EDT Ventricular Rate:  64 PR Interval:  190 QRS Duration:  108 QT Interval:  416 QTC Calculation: 429 R Axis:   -66  Text Interpretation: Normal sinus rhythm Right bundle branch block Left anterior fascicular block Bifascicular  block Minimal voltage criteria for LVH, may be normal variant ( R in aVL ) Septal infarct , age undetermined When compared with ECG of 02-Jul-2024 07:53, No significant change was found Confirmed by Lucien Blanc [phone]) on 07/02/2024 8:07:17 AM    Echocardiogram 08/15/2023   IMPRESSIONS     1. Left ventricular ejection fraction, by estimation, is 65 to 70%. The  left ventricle has normal function. The left ventricle has no regional  wall motion abnormalities. There is moderate concentric left ventricular  hypertrophy. Left ventricular  diastolic parameters are consistent with Grade I diastolic dysfunction  (impaired relaxation).   2. Right ventricular systolic function is normal. The right ventricular  size is normal.   3. Left atrial size was mild to moderately dilated.   4. The mitral valve is normal in structure. Trivial mitral valve  regurgitation. No evidence of mitral stenosis. Moderate mitral annular  calcification.   5. The aortic valve is tricuspid. There is severe calcifcation of the  aortic valve. Aortic valve regurgitation is not visualized. Moderate  aortic valve stenosis. Aortic valve area, by VTI measures 1.20 cm. Aortic  valve mean gradient measures 16.2 mmHg.   Aortic valve Vmax measures 2.51 m/s.   6. Aortic dilatation noted. There is mild dilatation of the ascending  aorta, measuring 41 mm.   7. The inferior vena cava is normal in size with greater than 50%  respiratory variability, suggesting right atrial pressure of 3 mmHg.   FINDINGS   Left Ventricle: Left ventricular ejection fraction, by estimation, is 65  to 70%. The left ventricle has normal function. The left ventricle has no  regional wall motion abnormalities. The left ventricular internal cavity  size was normal in size. There is   moderate concentric left ventricular hypertrophy. Left ventricular  diastolic parameters are consistent with Grade I diastolic dysfunction  (impaired relaxation).   Right  Ventricle: The right ventricular size is normal. No increase in  right ventricular wall thickness. Right ventricular systolic function is  normal.   Left Atrium: Left atrial size was mild to moderately dilated.   Right Atrium: Right atrial size was normal in size.   Pericardium: There is no evidence of pericardial effusion.   The mitral valve is normal in structure. Moderate mitral annular  calcification. Trivial mitral valve regurgitation. No evidence of mitral  valve stenosis.  Tricuspid Valve: The tricuspid valve is normal in structure. Tricuspid  valve regurgitation is mild . No evidence of tricuspid stenosis.   The aortic valve is tricuspid. There is severe calcifcation of the aortic  valve. Aortic valve regurgitation is not visualized. Moderate aortic  stenosis is present.  Pulmonic Valve: The pulmonic valve was normal in structure. Pulmonic valve  regurgitation is not visualized. No evidence of pulmonic stenosis.   Aorta: Aortic dilatation noted. There is mild dilatation of the ascending  aorta, measuring 41 mm.   Venous: The inferior vena cava is normal in size with greater than 50%  respiratory variability, suggesting right atrial pressure of 3 mmHg.   IAS/Shunts: No atrial level shunt detected by color flow Doppler.          Risk Assessment/Calculations  CHA2DS2-VASc Score = 6  This indicates a 9.7% annual risk of stroke. The patient's score is based upon: CHF History: 1 HTN History: 1 Diabetes History: 0 Stroke History: 0 Vascular Disease History: 1 Age Score: 2 Gender Score: 1      Physical Exam VS:  BP 129/80   Pulse 64   Ht 5' 6 (1.676 m)   Wt 253 lb 3.2 oz (114.9 kg)   SpO2 98%   BMI 40.87 kg/m        Wt Readings from Last 3 Encounters:  07/02/24 253 lb 3.2 oz (114.9 kg)  05/30/24 245 lb (111.1 kg)  04/16/24 257 lb (116.6 kg)    GEN: Well nourished, well developed in no acute distress NECK: No JVD; No carotid bruits CARDIAC: RRR, no  murmurs, rubs, gallops RESPIRATORY:  Clear to auscultation without rales, wheezing or rhonchi  ABDOMEN: Soft, non-tender, non-distended EXTREMITIES:  No edema; No deformity   ASSESSMENT AND PLAN  Preop Clearance   Ms. Johns's perioperative risk of a major cardiac event is 0.9% according to the Revised Cardiac Risk Index (RCRI).  Therefore, she is at low risk for perioperative complications.   Her functional capacity is good at 5.32 METs according to the Duke Activity Status Index (DASI). Recommendations: According to ACC/AHA guidelines, no further cardiovascular testing needed.  The patient may proceed to surgery at acceptable risk.   Antiplatelet and/or Anticoagulation Recommendations:  Eliquis  (Apixaban ) can be held for 2 days prior to surgery.  Please resume post op when felt to be safe.     Atrial fibrillation, stable on anticoagulation and rate control Atrial fibrillation well-managed with Eliquis  and metoprolol . Heart rate controlled at 64 bpm. - Continue Eliquis  and metoprolol  as prescribed. -NSR today  Right bundle branch block, stable Right bundle branch block consistent with previous EKG findings. No new changes.  Moderate aortic valve stenosis with murmur, stable Long-standing aortic valve with murmur, likely hereditary. No new symptoms reported. - Review echocardiogram results for aortic valve assessment.  Chronic lower extremity edema Chronic lower extremity edema managed with lymphatic supplement and compression stockings after discontinuation of torsemide  due to renal concerns. - Continue use of compression stockings. - Elevate legs to manage edema.     Dispo: She can follow-up with Dr. Pietro in 6 months  Signed, Orren LOISE Fabry, PA-C

## 2024-07-02 ENCOUNTER — Ambulatory Visit: Attending: Physician Assistant | Admitting: Physician Assistant

## 2024-07-02 VITALS — BP 129/80 | HR 64 | Ht 66.0 in | Wt 253.2 lb

## 2024-07-02 DIAGNOSIS — R002 Palpitations: Secondary | ICD-10-CM | POA: Diagnosis not present

## 2024-07-02 DIAGNOSIS — R0609 Other forms of dyspnea: Secondary | ICD-10-CM

## 2024-07-02 DIAGNOSIS — I4891 Unspecified atrial fibrillation: Secondary | ICD-10-CM | POA: Diagnosis not present

## 2024-07-02 DIAGNOSIS — I10 Essential (primary) hypertension: Secondary | ICD-10-CM | POA: Diagnosis not present

## 2024-07-02 DIAGNOSIS — I350 Nonrheumatic aortic (valve) stenosis: Secondary | ICD-10-CM | POA: Diagnosis not present

## 2024-07-02 DIAGNOSIS — I5032 Chronic diastolic (congestive) heart failure: Secondary | ICD-10-CM

## 2024-07-02 MED ORDER — APIXABAN 5 MG PO TABS: 5.0000 mg | ORAL_TABLET | Freq: Two times a day (BID) | ORAL | 11 refills | Status: AC

## 2024-07-02 MED ORDER — SPIRONOLACTONE 25 MG PO TABS: 25.0000 mg | ORAL_TABLET | Freq: Every day | ORAL | 3 refills | Status: DC

## 2024-07-02 MED ORDER — DAPAGLIFLOZIN PROPANEDIOL 10 MG PO TABS: 10.0000 mg | ORAL_TABLET | Freq: Every day | ORAL | 3 refills | Status: AC

## 2024-07-02 MED ORDER — METOPROLOL TARTRATE 50 MG PO TABS: 75.0000 mg | ORAL_TABLET | Freq: Two times a day (BID) | ORAL | 11 refills | Status: AC

## 2024-07-02 NOTE — Patient Instructions (Signed)
 Medication Instructions:  Your physician recommends that you continue on your current medications as directed. Please refer to the Current Medication list given to you today.  *If you need a refill on your cardiac medications before your next appointment, please call your pharmacy*  Lab Work: NONE If you have labs (blood work) drawn today and your tests are completely normal, you will receive your results only by: MyChart Message (if you have MyChart) OR A paper copy in the mail If you have any lab test that is abnormal or we need to change your treatment, we will call you to review the results.  Testing/Procedures: NONE  Follow-Up: At [facility], you and your health needs are our priority.  As part of our continuing mission to provide you with exceptional heart care, our providers are all part of one team.  This team includes your primary Cardiologist (physician) and Advanced Practice Providers or APPs (Physician Assistants and Nurse Practitioners) who all work together to provide you with the care you need, when you need it.  Your next appointment:   6 month(s)  Provider:   Redell Shallow, MD   We recommend signing up for the patient portal called MyChart.  Sign up information is provided on this After Visit Summary.  MyChart is used to connect with patients for Virtual Visits (Telemedicine).  Patients are able to view lab/test results, encounter notes, upcoming appointments, etc.  Non-urgent messages can be sent to your provider as well.   To learn more about what you can do with MyChart, go to [email or web address].   Other Instructions ZADA PALIN, NP - Address: [address], [place], [place] [address] Phone: [phone]

## 2024-07-04 NOTE — Telephone Encounter (Signed)
 Will refax the preop clearance to the surgeons. Kalynn from the surgeons office has provided the following fax number [phone]

## 2024-07-04 NOTE — Telephone Encounter (Signed)
 Caller (Kalynn) is following-up on the status of patient's clearance and wants a copy of patient's office clearance visit faxed to fax# [phone].

## 2024-07-05 ENCOUNTER — Encounter (HOSPITAL_BASED_OUTPATIENT_CLINIC_OR_DEPARTMENT_OTHER)

## 2024-08-06 ENCOUNTER — Telehealth: Payer: Self-pay | Admitting: Medical-Surgical

## 2024-08-06 NOTE — Telephone Encounter (Signed)
 Copied from CRM #[id number]. Topic: Appointments - Appointment Scheduling >> Aug 06, 2024 11:44 AM Montie POUR wrote: Patient/patient representative is calling to schedule an appointment. Refer to attachments for appointment information.  [name] wants to book a new appointment with Kellie Palin DNP to establish as a new patient.  Epic is giving me this error:  No solution found between 08/06/2024 and 08/08/2025. No search results found between 9/30 and 10/1. KMS shows she is accepting new patients. [name] in unhappy with Dr everitt Birk office and that is why she wants to change. Please call her at [phone].  Thanks  Please advise

## 2024-08-07 ENCOUNTER — Encounter (HOSPITAL_BASED_OUTPATIENT_CLINIC_OR_DEPARTMENT_OTHER): Admitting: Family Medicine

## 2024-08-07 NOTE — Telephone Encounter (Signed)
 Called spoke with patient she in now scheduled

## 2024-08-14 ENCOUNTER — Other Ambulatory Visit (HOSPITAL_BASED_OUTPATIENT_CLINIC_OR_DEPARTMENT_OTHER)

## 2024-08-19 ENCOUNTER — Encounter (HOSPITAL_BASED_OUTPATIENT_CLINIC_OR_DEPARTMENT_OTHER)

## 2024-08-22 DIAGNOSIS — Z8619 Personal history of other infectious and parasitic diseases: Secondary | ICD-10-CM | POA: Diagnosis not present

## 2024-08-26 ENCOUNTER — Encounter: Payer: Self-pay | Admitting: Medical-Surgical

## 2024-08-26 ENCOUNTER — Ambulatory Visit: Admitting: Medical-Surgical

## 2024-08-26 VITALS — BP 149/85 | HR 81 | Resp 20 | Ht 66.0 in | Wt 261.1 lb

## 2024-08-26 DIAGNOSIS — N1831 Chronic kidney disease, stage 3a: Secondary | ICD-10-CM | POA: Diagnosis not present

## 2024-08-26 DIAGNOSIS — N3942 Incontinence without sensory awareness: Secondary | ICD-10-CM | POA: Diagnosis not present

## 2024-08-26 DIAGNOSIS — Z1239 Encounter for other screening for malignant neoplasm of breast: Secondary | ICD-10-CM

## 2024-08-26 DIAGNOSIS — Z7689 Persons encountering health services in other specified circumstances: Secondary | ICD-10-CM

## 2024-08-26 DIAGNOSIS — Z780 Asymptomatic menopausal state: Secondary | ICD-10-CM

## 2024-08-26 DIAGNOSIS — R7303 Prediabetes: Secondary | ICD-10-CM

## 2024-08-26 DIAGNOSIS — I5032 Chronic diastolic (congestive) heart failure: Secondary | ICD-10-CM

## 2024-08-26 DIAGNOSIS — Z23 Encounter for immunization: Secondary | ICD-10-CM | POA: Diagnosis not present

## 2024-08-26 DIAGNOSIS — I10 Essential (primary) hypertension: Secondary | ICD-10-CM

## 2024-08-26 DIAGNOSIS — K760 Fatty (change of) liver, not elsewhere classified: Secondary | ICD-10-CM

## 2024-08-26 MED ORDER — AMBULATORY NON FORMULARY MEDICATION: 11 refills | Status: AC

## 2024-08-26 NOTE — Progress Notes (Unsigned)
 New Patient Office Visit  Subjective:  Patient ID: [name], female    DOB: 15-Mar-1941  Age: 83 y.o. MRN: [id number]  CC:  Chief Complaint  Patient presents with   Establish Care   HPI [name] presents to establish care.   Abnormal bleeding - Abnormal vaginal bleeding has limited ability to swim, her primary form of exercise. - Surgery has been canceled and rescheduled twice. - Currently taking a new medication to manage bleeding. - Upcoming hysteroscopy with D&C  Activity intolerance and dyspnea - Significant activity intolerance secondary to Afib/CHF, requiring rest after walking short distances. - Progressive worsening of activity intolerance over time. - Occasional dyspnea. - History of congestive heart failure and aortic valve issues.  Peripheral edema - Chronic foot and leg swelling. - Prefers lymphatic system support over torsemide  due to side effects, including dry mouth and frequent urination.  Urinary incontinence - Frequent and sometimes unnoticed urination. - Requires use of Depends for bladder control. - Frequent change of adult briefs to prevent skin concerns. - Requests prescription to help manage cost of incontinence supplies.  Immunization concerns - Concerned about vaccination status, specifically pneumonia and influenza vaccines. - Received pneumonia vaccines in 2012 and 2016.  - No influenza vaccination in over 20 years.  - Prevnar 20 given today.  - High-dose influenza vaccine given today.       Past Medical History:  Diagnosis Date   A-fib [facility])    Aortic stenosis    Congestive heart failure (CHF) (HCC)    Hypertension     Past Surgical History:  Procedure Laterality Date   BILATERAL CARPAL TUNNEL RELEASE     DILATION AND CURETTAGE OF UTERUS     JOINT REPLACEMENT     TOTAL KNEE ARTHROPLASTY Bilateral     Family History  Problem Relation Age of Onset   Angina Mother     Social History   Socioeconomic History    Marital status: Widowed    Spouse name: Not on file   Number of children: 3   Years of education: Not on file   Highest education level: Not on file  Occupational History   Not on file  Tobacco Use   Smoking status: Never    Passive exposure: Never   Smokeless tobacco: Never  Vaping Use   Vaping status: Never Used  Substance and Sexual Activity   Alcohol use: Yes    Comment: rare   Drug use: Never   Sexual activity: Not Currently  Other Topics Concern   Not on file  Social History Narrative   Not on file   Social Drivers of Health   Financial Resource Strain: Not on file  Food Insecurity: Low Risk  (04/08/2024)   Received from [facility]   Hunger Vital Sign    Within the past 12 months, you worried that your food would run out before you got money to buy more: Never true    Within the past 12 months, the food you bought just didn't last and you didn't have money to get more. : Never true  Transportation Needs: No Transportation Needs (04/08/2024)   Received from [facility]    In the past 12 months, has lack of reliable transportation kept you from medical appointments, meetings, work or from getting things needed for daily living? : No  Physical Activity: Not on file  Stress: Not on file  Social Connections: Not on file  Intimate Partner Violence: Not on  file    ROS Review of Systems  Constitutional:  Negative for chills, fatigue, fever and unexpected weight change.  HENT:  Positive for postnasal drip. Negative for congestion, rhinorrhea, sinus pressure and sore throat.   Respiratory:  Negative for cough, chest tightness and shortness of breath.   Cardiovascular:  Negative for chest pain, palpitations and leg swelling.  Gastrointestinal:  Positive for abdominal pain (LLQ). Negative for constipation, diarrhea, nausea and vomiting.  Endocrine: Negative for cold intolerance and heat intolerance.  Genitourinary:  Positive for frequency. Negative for  dysuria, urgency, vaginal bleeding and vaginal discharge.  Skin:  Negative for rash and wound.  Neurological:  Negative for dizziness, light-headedness and headaches.  Hematological:  Does not bruise/bleed easily.  Psychiatric/Behavioral:  Negative for self-injury, sleep disturbance and suicidal ideas. The patient is not nervous/anxious.    Objective:   Today's Vitals: BP (!) 149/85 (BP Location: Right Arm, Cuff Size: Normal)   Pulse 81   Resp 20   Ht 5' 6 (1.676 m)   Wt 261 lb 1.9 oz (118.4 kg)   SpO2 98%   BMI 42.15 kg/m   Physical Exam Vitals reviewed.  Constitutional:      General: She is not in acute distress.    Appearance: Normal appearance. She is obese. She is not ill-appearing.  HENT:     Head: Normocephalic and atraumatic.  Cardiovascular:     Rate and Rhythm: Normal rate and regular rhythm.     Pulses: Normal pulses.     Heart sounds: Normal heart sounds. No murmur heard.    No friction rub. No gallop.  Pulmonary:     Effort: Pulmonary effort is normal. No respiratory distress.     Breath sounds: Normal breath sounds. No wheezing.  Musculoskeletal:     Right lower leg: Edema present.     Left lower leg: Edema present.  Skin:    General: Skin is warm and dry.  Neurological:     Mental Status: She is alert and oriented to person, place, and time.  Psychiatric:        Mood and Affect: Mood normal.        Behavior: Behavior normal.        Thought Content: Thought content normal.        Judgment: Judgment normal.     Assessment & Plan:   1. Encounter to establish care (Primary) Reviewed available information and discussed care concerns with patient.   2. Morbid obesity (HCC) BMI 42. Swimming for exercise but activity limited by CHF and postmenopausal bleeding.   3. Essential hypertension  Managed by Cardiology. BP slightly elevated on arrival, remains so on recheck however, not concerning today. Continue monitoring with Cardiology. Routing note to Dr.  Pietro per patient request due to increased dyspnea leading to worsening activity intolerance.   4. Stage 3a chronic kidney disease (HCC) ***  5. Prediabetes ***  6. Fatty liver Known fatty liver disease in the setting of morbid obesity.  7. Chronic diastolic heart failure (HCC) Managed by cardiology.  Worsening activity intolerance concerning.  Routing note and message to Dr. Pietro.  8. Urinary incontinence without sensory awareness Prescription for depends adult briefs printed and faxed to [facility] pharmacy with office notes.  9. Postmenopausal DEXA scan ordered. - DG Bone Density; Future  10. Encounter for breast cancer screening using non-mammogram modality History of breast cancer screening using ultrasound only.  Sending orders to [facility] in [place] per patient request. - US  BREAST COMPLETE UNI  RIGHT INC AXILLA; Future - US  BREAST COMPLETE UNI LEFT INC AXILLA; Future  11. Immunization due Prevnar 20 and high-dose flu given in office today. - Pneumococcal conjugate vaccine 20-valent (Prevnar 20) - Flu vaccine HIGH DOSE PF(Fluzone Trivalent)   Outpatient Encounter Medications as of 08/26/2024  Medication Sig   AMBULATORY NON FORMULARY MEDICATION Medication Name: Depends adult briefs, size XXL, 100 per month; Dx code: N15.42 Urinary incontinence without sensory awareness   apixaban  (ELIQUIS ) 5 MG TABS tablet Take 1 tablet (5 mg total) by mouth 2 (two) times daily.   Ascorbic Acid (VITAMIN C PO) Take by mouth daily.   Cholecalciferol (VITAMIN D3) 50 MCG (2000 UT) CAPS Take 2,000 Units by mouth every morning.   dapagliflozin  propanediol (FARXIGA ) 10 MG TABS tablet Take 1 tablet (10 mg total) by mouth daily before breakfast.   Magnesium 300 MG CAPS Take 600 mg by mouth at bedtime.   medroxyPROGESTERone (PROVERA) 5 MG tablet Take 5 mg by mouth in the morning and at bedtime.   metoprolol  tartrate (LOPRESSOR ) 50 MG tablet Take 1.5 tablets (75 mg total) by mouth 2 (two)  times daily.   Misc Natural Products (WATER PILL PO) Take by mouth. Lymph system support   norethindrone (GALLIFREY) 5 MG tablet Take 5 mg by mouth daily.   Nutritional Supplements (BRAIN SUPPORT PO) Take by mouth. Cerebra   POTASSIUM CITRATE PO Take by mouth at bedtime.   spironolactone  (ALDACTONE ) 25 MG tablet Take 1 tablet (25 mg total) by mouth daily.   vitamin B-12 (CYANOCOBALAMIN) 500 MCG tablet Take 500 mcg by mouth daily.   [DISCONTINUED] Coenzyme Q10 (CO Q-10) 200 MG CAPS Take by mouth. (Patient taking differently: Take by mouth. Couple times a week)   [DISCONTINUED] fluconazole  (DIFLUCAN ) 150 MG tablet By mouth today and repeat in 5-7 days for vaginal yeast infection. (Patient not taking: Reported on 07/02/2024)   [DISCONTINUED] torsemide  (DEMADEX ) 20 MG tablet Increase dose to 40 mg in the morning and keep dose at 20 mg in the afternoon. (Patient not taking: Reported on 07/02/2024)   [DISCONTINUED] Turmeric (QC TUMERIC COMPLEX PO) Take by mouth.   No facility-administered encounter medications on file as of 08/26/2024.    Follow-up: Return in about 3 months (around 11/26/2024) for chronic disease follow up.   Kellie FREDRIK Palin, DNP, APRN, FNP-BC [facility]

## 2024-08-28 ENCOUNTER — Telehealth: Payer: Self-pay

## 2024-08-28 NOTE — Telephone Encounter (Signed)
 LMVM advising the patient that I faxed the Rx to [facility] for her.

## 2024-09-10 DIAGNOSIS — Z01818 Encounter for other preprocedural examination: Secondary | ICD-10-CM | POA: Diagnosis not present

## 2024-09-10 DIAGNOSIS — N950 Postmenopausal bleeding: Secondary | ICD-10-CM | POA: Diagnosis not present

## 2024-09-13 ENCOUNTER — Ambulatory Visit: Payer: Self-pay

## 2024-09-13 NOTE — Telephone Encounter (Addendum)
 Copied from CRM #[id number]. Topic: Clinical - Medical Advice >> Sep 13, 2024 11:34 AM Winona R wrote: Pt states her office OBGYN needs to speak with someone in office in regards to some of her pre surgery testing results Please call Ty [facility] [place] [phone]

## 2024-09-18 DIAGNOSIS — Z170 Estrogen receptor positive status [ER+]: Secondary | ICD-10-CM | POA: Diagnosis not present

## 2024-09-18 DIAGNOSIS — N950 Postmenopausal bleeding: Secondary | ICD-10-CM | POA: Diagnosis not present

## 2024-09-18 DIAGNOSIS — C541 Malignant neoplasm of endometrium: Secondary | ICD-10-CM | POA: Diagnosis not present

## 2024-09-18 DIAGNOSIS — Z79899 Other long term (current) drug therapy: Secondary | ICD-10-CM | POA: Diagnosis not present

## 2024-09-18 DIAGNOSIS — Z1721 Progesterone receptor positive status: Secondary | ICD-10-CM | POA: Diagnosis not present

## 2024-09-18 DIAGNOSIS — R9389 Abnormal findings on diagnostic imaging of other specified body structures: Secondary | ICD-10-CM | POA: Diagnosis not present

## 2024-09-18 DIAGNOSIS — N840 Polyp of corpus uteri: Secondary | ICD-10-CM | POA: Diagnosis not present

## 2024-09-18 DIAGNOSIS — Z7901 Long term (current) use of anticoagulants: Secondary | ICD-10-CM | POA: Diagnosis not present

## 2024-09-26 DIAGNOSIS — B351 Tinea unguium: Secondary | ICD-10-CM | POA: Diagnosis not present

## 2024-10-01 DIAGNOSIS — R600 Localized edema: Secondary | ICD-10-CM | POA: Diagnosis not present

## 2024-10-01 DIAGNOSIS — D631 Anemia in chronic kidney disease: Secondary | ICD-10-CM | POA: Diagnosis not present

## 2024-10-01 DIAGNOSIS — I5032 Chronic diastolic (congestive) heart failure: Secondary | ICD-10-CM | POA: Diagnosis not present

## 2024-10-01 DIAGNOSIS — N1831 Chronic kidney disease, stage 3a: Secondary | ICD-10-CM | POA: Diagnosis not present

## 2024-10-01 DIAGNOSIS — N189 Chronic kidney disease, unspecified: Secondary | ICD-10-CM | POA: Diagnosis not present

## 2024-10-01 DIAGNOSIS — N1832 Chronic kidney disease, stage 3b: Secondary | ICD-10-CM | POA: Diagnosis not present

## 2024-10-01 DIAGNOSIS — E559 Vitamin D deficiency, unspecified: Secondary | ICD-10-CM | POA: Diagnosis not present

## 2024-10-01 DIAGNOSIS — N2581 Secondary hyperparathyroidism of renal origin: Secondary | ICD-10-CM | POA: Diagnosis not present

## 2024-10-01 DIAGNOSIS — I129 Hypertensive chronic kidney disease with stage 1 through stage 4 chronic kidney disease, or unspecified chronic kidney disease: Secondary | ICD-10-CM | POA: Diagnosis not present

## 2024-10-01 DIAGNOSIS — I350 Nonrheumatic aortic (valve) stenosis: Secondary | ICD-10-CM | POA: Diagnosis not present

## 2024-10-02 ENCOUNTER — Other Ambulatory Visit: Payer: Self-pay | Admitting: Cardiology

## 2024-10-02 DIAGNOSIS — R0609 Other forms of dyspnea: Secondary | ICD-10-CM

## 2024-10-02 DIAGNOSIS — I5032 Chronic diastolic (congestive) heart failure: Secondary | ICD-10-CM

## 2024-10-14 DIAGNOSIS — I350 Nonrheumatic aortic (valve) stenosis: Secondary | ICD-10-CM | POA: Diagnosis not present

## 2024-10-14 DIAGNOSIS — C541 Malignant neoplasm of endometrium: Secondary | ICD-10-CM | POA: Diagnosis not present

## 2024-10-17 ENCOUNTER — Telehealth (HOSPITAL_BASED_OUTPATIENT_CLINIC_OR_DEPARTMENT_OTHER): Payer: Self-pay | Admitting: *Deleted

## 2024-10-17 NOTE — Telephone Encounter (Signed)
° °  Pre-operative Risk Assessment    Patient Name: [name]  DOB: 01-30-1941 MRN: [id number]   Date of last office visit: 07/02/2024 Date of next office visit: None  Request for Surgical Clearance    Procedure:  Robotic Hysterectomy XI W/Salpingo Oophorectomy XL SLND  Date of Surgery:  Clearance 11/19/24                                 Surgeon:  Dr. Ozell Sor Surgeon's Group or Practice Name:  [facility] Phone number:  [phone] Fax number:  [phone]   Type of Clearance Requested:   - Medical  - Pharmacy:  Hold Apixaban  (Eliquis ) Not indicated   Type of Anesthesia:  Not Indicated   Additional requests/questions:  Office notes from [facility] in media.  Signed, Edsel Grayce Sanders   10/17/2024, 8:57 AM

## 2024-10-22 ENCOUNTER — Telehealth (HOSPITAL_BASED_OUTPATIENT_CLINIC_OR_DEPARTMENT_OTHER): Payer: Self-pay | Admitting: *Deleted

## 2024-10-22 NOTE — Telephone Encounter (Signed)
 Patient with diagnosis of A Fib on Eliquis  for anticoagulation.    Procedure: Robotic Hysterectomy XI W/Salpingo Oophorectomy XL SLN  Date of procedure: 11/19/24   CHA2DS2-VASc Score = 6  This indicates a 9.7% annual risk of stroke. The patient's score is based upon: CHF History: 1 HTN History: 1 Diabetes History: 0 Stroke History: 0 Vascular Disease History: 1 Age Score: 2 Gender Score: 1     CrCl 82 ml/min Platelet count 218K  Patient has not had an Afib/aflutter ablation in the last 3 months, DCCV within the last 4 weeks or a watchman implanted in the last 45 days    Per office protocol, patient can hold Eliquis  for 2 days prior to procedure.    **This guidance is not considered finalized until pre-operative APP has relayed final recommendations.**

## 2024-10-22 NOTE — Telephone Encounter (Signed)
° °  Name: [name]  DOB: 1941/09/06  MRN: [id number]  Primary Cardiologist: Redell Shallow, MD  Chart reviewed as part of pre-operative protocol coverage. Because of [name]'s past medical history and time since last visit, she will require a follow-up telephone visit in order to better assess preoperative cardiovascular risk.  Pre-op covering staff: - Please schedule appointment and call patient to inform them. If patient already had an upcoming appointment within acceptable timeframe, please add pre-op clearance to the appointment notes so provider is aware. - Please contact requesting surgeon's office via preferred method (i.e, phone, fax) to inform them of need for appointment prior to surgery.  Per office protocol, patient can hold Eliquis  for 2 days prior to procedure.  Please resume when medically safe to do so.  Orren LOISE Fabry, PA-C  10/22/2024, 12:54 PM

## 2024-10-22 NOTE — Telephone Encounter (Signed)
 Pt has been scheduled tele preop appt. Med rec and consent are done.

## 2024-10-22 NOTE — Telephone Encounter (Signed)
 Pt has been scheduled tele preop appt. Med rec and consent are done.      Patient Consent for Virtual Visit        [name] has provided verbal consent on 10/22/2024 for a virtual visit (video or telephone).   CONSENT FOR VIRTUAL VISIT FOR:  [name]  By participating in this virtual visit I agree to the following:  I hereby voluntarily request, consent and authorize [facility] and its employed or contracted physicians, physician assistants, nurse practitioners or other licensed health care professionals (the Practitioner), to provide me with telemedicine health care services (the Services) as deemed necessary by the treating Practitioner. I acknowledge and consent to receive the Services by the Practitioner via telemedicine. I understand that the telemedicine visit will involve communicating with the Practitioner through live audiovisual communication technology and the disclosure of certain medical information by electronic transmission. I acknowledge that I have been given the opportunity to request an in-person assessment or other available alternative prior to the telemedicine visit and am voluntarily participating in the telemedicine visit.  I understand that I have the right to withhold or withdraw my consent to the use of telemedicine in the course of my care at any time, without affecting my right to future care or treatment, and that the Practitioner or I may terminate the telemedicine visit at any time. I understand that I have the right to inspect all information obtained and/or recorded in the course of the telemedicine visit and may receive copies of available information for a reasonable fee.  I understand that some of the potential risks of receiving the Services via telemedicine include:  Delay or interruption in medical evaluation due to technological equipment failure or disruption; Information transmitted may not be sufficient (e.g. poor resolution  of images) to allow for appropriate medical decision making by the Practitioner; and/or  In rare instances, security protocols could fail, causing a breach of personal health information.  Furthermore, I acknowledge that it is my responsibility to provide information about my medical history, conditions and care that is complete and accurate to the best of my ability. I acknowledge that Practitioner's advice, recommendations, and/or decision may be based on factors not within their control, such as incomplete or inaccurate data provided by me or distortions of diagnostic images or specimens that may result from electronic transmissions. I understand that the practice of medicine is not an exact science and that Practitioner makes no warranties or guarantees regarding treatment outcomes. I acknowledge that a copy of this consent can be made available to me via my patient portal [facility] MyChart), or I can request a printed copy by calling the office of [facility].    I understand that my insurance will be billed for this visit.   I have read or had this consent read to me. I understand the contents of this consent, which adequately explains the benefits and risks of the Services being provided via telemedicine.  I have been provided ample opportunity to ask questions regarding this consent and the Services and have had my questions answered to my satisfaction. I give my informed consent for the services to be provided through the use of telemedicine in my medical care

## 2024-11-06 ENCOUNTER — Ambulatory Visit: Admitting: Physician Assistant

## 2024-11-06 DIAGNOSIS — Z01810 Encounter for preprocedural cardiovascular examination: Secondary | ICD-10-CM

## 2024-11-06 NOTE — Progress Notes (Signed)
 "   Virtual Visit via Telephone Note   Because of [name] co-morbid illnesses, she is at least at moderate risk for complications without adequate follow up.  This format is felt to be most appropriate for this patient at this time.  Due to technical limitations with video connection (technology), today's appointment will be conducted as an audio only telehealth visit, and [name] verbally agreed to proceed in this manner.   All issues noted in this document were discussed and addressed.  No physical exam could be performed with this format.  Evaluation Performed:  Preoperative cardiovascular risk assessment _____________   Date:  11/06/2024   Patient ID:  [name], DOB Feb 10, 1941, MRN [id number] Patient Location:  Home Provider location:   Office  Primary Care Provider:  Willo Mini, NP Primary Cardiologist:  Redell Shallow, MD  Chief Complaint / Patient Profile   83 y.o. y/o female with a h/o accelerated heart rate, chronic diastolic CHF, aortic valve stenosis, exertional dyspnea, abnormal kidney function and hypertension who is pending hysterectomy and presents today for telephonic preoperative cardiovascular risk assessment.  History of Present Illness    [name] is a 83 y.o. female who presents via audio/video conferencing for a telehealth visit today.  Pt was last seen in cardiology clinic on 07/02/2024 by myself.  At that time [name] was doing well.  The patient is now pending procedure as outlined above. Since her last visit, she has been doing well but still dealing with vision changes.  She does do some household tasks but does not engage in yard work.  She has been swimming daily for exercise but has not been able to the past couple of months.  She is being evaluated for possible aortic valve replacement and recently underwent an echocardiogram and CT scan through [facility].  I will attach her echocardiogram that we did October 2024.   Her aortic stenosis was determined to be mild to moderate on her most recent echo last week [facility].  This was reviewed with the patient over the phone today.   Per office protocol patient can hold Eliquis  for 2 days prior to procedure.  Please resume when medically safe to do so.   Echo 08/15/23  IMPRESSIONS     1. Left ventricular ejection fraction, by estimation, is 65 to 70%. The  left ventricle has normal function. The left ventricle has no regional  wall motion abnormalities. There is moderate concentric left ventricular  hypertrophy. Left ventricular  diastolic parameters are consistent with Grade I diastolic dysfunction  (impaired relaxation).   2. Right ventricular systolic function is normal. The right ventricular  size is normal.   3. Left atrial size was mild to moderately dilated.   4. The mitral valve is normal in structure. Trivial mitral valve  regurgitation. No evidence of mitral stenosis. Moderate mitral annular  calcification.   5. The aortic valve is tricuspid. There is severe calcifcation of the  aortic valve. Aortic valve regurgitation is not visualized. Moderate  aortic valve stenosis. Aortic valve area, by VTI measures 1.20 cm. Aortic  valve mean gradient measures 16.2 mmHg.   Aortic valve Vmax measures 2.51 m/s.   6. Aortic dilatation noted. There is mild dilatation of the ascending  aorta, measuring 41 mm.   7. The inferior vena cava is normal in size with greater than 50%  respiratory variability, suggesting right atrial pressure of 3 mmHg.   FINDINGS   Left Ventricle:  Left ventricular ejection fraction, by estimation, is 65  to 70%. The left ventricle has normal function. The left ventricle has no  regional wall motion abnormalities. The left ventricular internal cavity  size was normal in size. There is   moderate concentric left ventricular hypertrophy. Left ventricular  diastolic parameters are consistent with Grade I diastolic dysfunction   (impaired relaxation).   Right Ventricle: The right ventricular size is normal. No increase in  right ventricular wall thickness. Right ventricular systolic function is  normal.   Left [facility]: Left atrial size was mild to moderately dilated.   Right [facility]: Right atrial size was normal in size.   Pericardium: There is no evidence of pericardial effusion.   The mitral valve is normal in structure. Moderate mitral annular  calcification. Trivial mitral valve regurgitation. No evidence of mitral  valve stenosis.  Tricuspid Valve: The tricuspid valve is normal in structure. Tricuspid  valve regurgitation is mild . No evidence of tricuspid stenosis.   The aortic valve is tricuspid. There is severe calcifcation of the aortic  valve. Aortic valve regurgitation is not visualized. Moderate aortic  stenosis is present.  Pulmonic Valve: The pulmonic valve was normal in structure. Pulmonic valve  regurgitation is not visualized. No evidence of pulmonic stenosis.   Aorta: Aortic dilatation noted. There is mild dilatation of the ascending  aorta, measuring 41 mm.   Venous: The inferior vena cava is normal in size with greater than 50%  respiratory variability, suggesting right atrial pressure of 3 mmHg.   IAS/Shunts: No atrial level shunt detected by color flow Doppler.     LEFT VENTRICLE  PLAX 2D  LVIDd:         4.10 cm   Diastology  LVIDs:         2.50 cm   LV e' medial:    4.46 cm/s  LV PW:         1.50 cm   LV E/e' medial:  14.1  LV IVS:        1.50 cm   LV e' lateral:   7.94 cm/s  LVOT diam:     2.00 cm   LV E/e' lateral: 7.9  LV SV:         62  LV SV Index:   28  LVOT Area:     3.14 cm     RIGHT VENTRICLE             IVC  RV Basal diam:  3.30 cm     IVC diam: 1.80 cm  RV S prime:     14.20 cm/s  TAPSE (M-mode): 2.5 cm   LEFT ATRIUM             Index        RIGHT ATRIUM          Index  LA diam:        5.00 cm 2.25 cm/m   RA Area:     8.45 cm  LA Vol (A2C):   66.4 ml  29.88 ml/m  RA Volume:   16.10 ml 7.24 ml/m  LA Vol (A4C):   63.5 ml 28.57 ml/m  LA Biplane Vol: 66.1 ml 29.74 ml/m   AORTIC VALVE  AV Area (Vmax):    1.22 cm  AV Area (Vmean):   1.10 cm  AV Area (VTI):     1.20 cm  AV Vmax:           250.60 cm/s  AV Vmean:  190.800 cm/s  AV VTI:            0.512 m  AV Peak Grad:      25.1 mmHg  AV Mean Grad:      16.2 mmHg  LVOT Vmax:         97.50 cm/s  LVOT Vmean:        66.900 cm/s  LVOT VTI:          0.196 m  LVOT/AV VTI ratio: 0.38    AORTA  Ao Root diam: 3.40 cm  Ao Asc diam:  4.10 cm   MITRAL VALVE                TRICUSPID VALVE  MV Area (PHT): 3.53 cm     TR Peak grad:   24.8 mmHg  MV Area VTI:   3.03 cm     TR Vmax:        249.00 cm/s  MV Peak grad:  4.7 mmHg  MV Mean grad:  2.0 mmHg     SHUNTS  MV Vmax:       1.08 m/s     Systemic VTI:  0.20 m  MV Vmean:      73.6 cm/s    Systemic Diam: 2.00 cm  MV Decel Time: 215 msec  MV E velocity: 62.90 cm/s  MV A velocity: 103.50 cm/s  MV E/A ratio:  0.61   Kellie Fuel MD  Electronically signed by Kellie Fuel MD  Signature Date/Time: 08/15/2023/12:10:10 PM        Final       Past Medical History    Past Medical History:  Diagnosis Date   A-fib (HCC)    Aortic stenosis    Congestive heart failure (CHF) (HCC)    Hypertension    Past Surgical History:  Procedure Laterality Date   BILATERAL CARPAL TUNNEL RELEASE     DILATION AND CURETTAGE OF UTERUS     JOINT REPLACEMENT     TOTAL KNEE ARTHROPLASTY Bilateral     Allergies  Allergies[1]  Home Medications    Prior to Admission medications  Medication Sig Start Date End Date Taking? Authorizing Provider  AMBULATORY NON FORMULARY MEDICATION Medication Name: Depends adult briefs, size XXL, 100 per month; Dx code: W60.57 Urinary incontinence without sensory awareness 08/26/24   Kellie Mini, NP  apixaban  (ELIQUIS ) 5 MG TABS tablet Take 1 tablet (5 mg total) by mouth 2 (two) times daily. 07/02/24    Lucien Orren SAILOR, PA-C  Ascorbic Acid (VITAMIN C PO) Take by mouth daily.    [provider]  Cholecalciferol (VITAMIN D3) 50 MCG (2000 UT) CAPS Take 2,000 Units by mouth every morning.    [provider]  dapagliflozin  propanediol (FARXIGA ) 10 MG TABS tablet Take 1 tablet (10 mg total) by mouth daily before breakfast. 07/02/24   Lucien Orren SAILOR, PA-C  Magnesium 300 MG CAPS Take 600 mg by mouth at bedtime.    [provider]  medroxyPROGESTERone (PROVERA) 5 MG tablet Take 5 mg by mouth in the morning and at bedtime. 06/04/24   [provider]  metoprolol  tartrate (LOPRESSOR ) 50 MG tablet Take 1.5 tablets (75 mg total) by mouth 2 (two) times daily. 07/02/24   Lucien Orren SAILOR, PA-C  Misc Natural Products (WATER PILL PO) Take by mouth. Lymph system support    [provider]  norethindrone (GALLIFREY) 5 MG tablet Take 5 mg by mouth daily.    [provider]  Nutritional Supplements (BRAIN  SUPPORT PO) Take by mouth. Cerebra    [provider]  POTASSIUM CITRATE PO Take by mouth at bedtime.    [provider]  spironolactone  (ALDACTONE ) 25 MG tablet Take 1 tablet (25 mg total) by mouth daily. 07/02/24   Lucien Orren SAILOR, PA-C  vitamin B-12 (CYANOCOBALAMIN) 500 MCG tablet Take 500 mcg by mouth daily.    [provider]    Physical Exam    Vital Signs:  [name] does not have vital signs available for review today.  Given telephonic nature of communication, physical exam is limited. AAOx3. NAD. Normal affect.  Speech and respirations are unlabored.  Accessory Clinical Findings    None  Assessment & Plan    1.  Preoperative Cardiovascular Risk Assessment:   Ms. Dix perioperative risk of a major cardiac event is 6.6% according to the Revised Cardiac Risk Index (RCRI).  Therefore, she is at high risk for perioperative complications.   Her functional capacity is fair at 4.98 METs according to the [facility] Activity  Status Index (DASI). Recommendations: According to ACC/AHA guidelines, no further cardiovascular testing needed.  The patient may proceed to surgery at acceptable risk.   Antiplatelet and/or Anticoagulation Recommendations:  Eliquis  (Apixaban ) can be held for 2 days prior to surgery.  Please resume post op when felt to be safe.     The patient was advised that if she develops new symptoms prior to surgery to contact our office to arrange for a follow-up visit, and she verbalized understanding.   A copy of this note will be routed to requesting surgeon.  Time:   Today, I have spent 10 minutes with the patient with telehealth technology discussing medical history, symptoms, and management plan.     Orren SAILOR Lucien, PA-C  11/06/2024, 11:48 AM      [1]  Allergies Allergen Reactions   Corticosteroids Anaphylaxis    Other Reaction(s): severe HTN   Gluten Meal Other (See Comments)    Pt. Stated,gives me sinuses.  Other Reaction(s): congestion/cough   Methylprednisolone  Sodium Succ     Elevates blood pressure Headaches   "

## 2024-11-08 ENCOUNTER — Encounter: Payer: Self-pay | Admitting: Medical-Surgical

## 2024-11-08 ENCOUNTER — Ambulatory Visit: Admitting: Medical-Surgical

## 2024-11-08 VITALS — BP 128/65 | HR 101 | Temp 97.6°F | Resp 20 | Ht 66.0 in | Wt 260.0 lb

## 2024-11-08 DIAGNOSIS — R21 Rash and other nonspecific skin eruption: Secondary | ICD-10-CM

## 2024-11-08 MED ORDER — CEPHALEXIN 500 MG PO CAPS: 500.0000 mg | ORAL_CAPSULE | Freq: Three times a day (TID) | ORAL | 0 refills | Status: DC

## 2024-11-08 NOTE — Progress Notes (Signed)
" ° °       Established patient visit   History of Present Illness   Discussed the use of AI scribe software for clinical note transcription with the patient, who gave verbal consent to proceed.  History of Present Illness   [name] is an 84 year old female with uterine cancer and aortic stenosis who presents with a rash and itching on her legs.  Pruritic rash of lower extremities - Onset Tuesday morning after wearing new, very tight bamboo compression socks - Erythematous, pruritic rash on both legs, worst on the posterior aspect of the right leg - No pain or tenderness - Severe pruritus disrupts sleep at night - Bag balm and Aspercreme spray provide symptomatic relief, especially at night - No associated fever, myalgias, or systemic symptoms  Uterine cancer - Recent CT scan with contrast on Monday showed no evidence of metastatic disease - Surgical intervention planned this month  Medication and allergen history - Takes torsemide  regularly - Avoids gluten and uses Benadryl  for gluten exposures without drowsiness - History of anaphylaxis to steroids and avoids all steroid use    Physical Exam   Physical Exam Vitals reviewed.  Constitutional:      General: She is not in acute distress.    Appearance: Normal appearance.  HENT:     Head: Normocephalic and atraumatic.  Cardiovascular:     Rate and Rhythm: Normal rate and regular rhythm.     Pulses: Normal pulses.     Heart sounds: Normal heart sounds. No murmur heard.    No friction rub. No gallop.  Pulmonary:     Effort: Pulmonary effort is normal. No respiratory distress.     Breath sounds: Normal breath sounds. No wheezing.  Skin:    General: Skin is warm and dry.     Findings: Erythema and rash present.  Neurological:     Mental Status: She is alert and oriented to person, place, and time.  Psychiatric:        Mood and Affect: Mood normal.        Behavior: Behavior normal.        Thought Content: Thought  content normal.        Judgment: Judgment normal.    Assessment & Plan   Lower extremity rash Redness and itching possibly due to contrast dye, cellulitis, venous insufficiency, or contact dermatitis. Anaphylaxis to steroids limits treatment options. - Prescribed Keflex  500mg  TID for potential cellulitis. - Continue Bag Balm and Aspercreme for itching relief. - Consider topical Benadryl  for itching. - Use cold compresses and elevate legs. - Monitor for improvement in redness and skin tone. - Seek urgent care over the weekend if symptoms worsen or if fever, muscle aches, or general malaise develop.   Follow up   Return if symptoms worsen or fail to improve. __________________________________ Zada FREDRIK Palin, DNP, APRN, FNP-BC Primary Care and Sports Medicine [facility] [place] "

## 2024-11-13 ENCOUNTER — Other Ambulatory Visit

## 2024-11-14 NOTE — Progress Notes (Signed)
 [name]                                          MRN: [id number]   11/14/2024   The VBCI Quality Team Specialist reviewed this patient medical record for the purposes of chart review for care gap closure. The following were reviewed: chart review for care gap closure-controlling blood pressure.    VBCI Quality Team

## 2024-11-18 ENCOUNTER — Ambulatory Visit: Payer: Self-pay

## 2024-11-18 NOTE — Telephone Encounter (Signed)
 FYI Only or Action Required?: FYI only for provider: appointment scheduled on 11/19/24.  Patient was last seen in primary care on 11/08/2024 by Willo Mini, NP.  Called Nurse Triage reporting Dysuria.  Symptoms began yesterday.  Interventions attempted: Nothing.  Symptoms are: unchanged.  Triage Disposition: See Physician Within 24 Hours  Patient/caregiver understands and will follow disposition?: Yes    Copied from CRM [id number]. Topic: Clinical - Red Word Triage >> Nov 18, 2024 11:27 AM Treva T wrote: [facility] that prompted transfer to Nurse Triage: Pt calling reports, she thinks she has a UTI.  Painful urination, with irritation. Just completed antibiotics Keflex .  Pt requesting appt for evaluation.   Reason for Disposition  Urinating more frequently than usual (i.e., frequency) OR new-onset of the feeling of an urgent need to urinate (i.e., urgency)  Answer Assessment - Initial Assessment Questions Pt called requesting appt for pain and irritation with urination x 24h. Pt states she completed Keflex , Friday 11/15/24. Pt denies fever, no abdominal pain, no hematuria. Appointment scheduled for evaluation. Patient agrees with plan of care, and will call back if anything changes, or if symptoms worsen.      1. SYMPTOM: What's the main symptom you're concerned about? (e.g., frequency, incontinence)     Irritation, pain with urination   2. ONSET: When did the  symptoms  start?     Yesterday   3. PAIN: Is there any pain? If Yes, ask: How bad is it? (Scale: 1-10; mild, moderate, severe)     Yes, irritating pain with urination   4. CAUSE: What do you think is causing the symptoms?     Suspects UTI   5. OTHER SYMPTOMS: Do you have any other symptoms? (e.g., blood in urine, fever, flank pain, pain with urination)     No  Protocols used: Urinary Symptoms-A-AH

## 2024-11-19 ENCOUNTER — Encounter: Payer: Self-pay | Admitting: Medical-Surgical

## 2024-11-19 ENCOUNTER — Ambulatory Visit: Admitting: Medical-Surgical

## 2024-11-19 VITALS — BP 125/71 | HR 103 | Temp 97.7°F | Resp 20 | Ht 66.0 in | Wt 260.0 lb

## 2024-11-19 DIAGNOSIS — R81 Glycosuria: Secondary | ICD-10-CM

## 2024-11-19 DIAGNOSIS — N1831 Chronic kidney disease, stage 3a: Secondary | ICD-10-CM | POA: Diagnosis not present

## 2024-11-19 DIAGNOSIS — R600 Localized edema: Secondary | ICD-10-CM | POA: Diagnosis not present

## 2024-11-19 DIAGNOSIS — R7303 Prediabetes: Secondary | ICD-10-CM

## 2024-11-19 LAB — POCT URINALYSIS DIP (CLINITEK)
Bilirubin, UA: NEGATIVE
Blood, UA: NEGATIVE
Glucose, UA: >=1000 mg/dL — AB
Ketones, POC UA: NEGATIVE mg/dL
Leukocytes, UA: NEGATIVE
Nitrite, UA: NEGATIVE
POC PROTEIN,UA: NEGATIVE
Spec Grav, UA: 1.015
Urobilinogen, UA: 0.2 U/dL
pH, UA: 6

## 2024-11-19 LAB — POCT GLYCOSYLATED HEMOGLOBIN (HGB A1C): Hemoglobin A1C: 5.9 % — AB (ref 4.0–5.6)

## 2024-11-19 NOTE — Progress Notes (Signed)
 "       Established patient visit   History of Present Illness   Discussed the use of AI scribe software for clinical note transcription with the patient, who gave verbal consent to proceed.  History of Present Illness   [name] is an 84 year old female with chronic kidney disease who presents with concerns about urinary symptoms and upcoming surgery.  Lower extremity symptoms - Marked improvement in right leg symptoms. - Left lower leg remains slightly red and mildly swollen after completing a course of Keflex . - No adverse effects from Keflex .  Urinary symptoms and perioperative concerns - Concerned about possible urinary issues prior to upcoming surgery rescheduled for January 22. - Inconclusive home urine test. - No dysuria, frequency, or urgency. - Concerned that any infection could interfere with surgery.  Renal function and diuretic management - Chronic kidney disease with recent labs: creatinine 1.26, BUN 25. - eGFR decreased from 58 in November to 42. - Torsemide  dose increased approximately 1.5 to 2 weeks ago. - Follows with nephrology but missed recent appointment due to scheduling conflict.  Glycemic status - Prediabetes with A1c 5.7 in July however urine testing showed glucosuria. - Limiting carbohydrates and sugars, choosing sugar-free beverages.  Physical Exam   Physical Exam Vitals reviewed.  Constitutional:      General: She is not in acute distress.    Appearance: Normal appearance.  HENT:     Head: Normocephalic and atraumatic.  Cardiovascular:     Rate and Rhythm: Normal rate and regular rhythm.     Pulses: Normal pulses.     Heart sounds: Normal heart sounds. No murmur heard.    No friction rub. No gallop.  Pulmonary:     Effort: Pulmonary effort is normal. No respiratory distress.     Breath sounds: Normal breath sounds. No wheezing.  Musculoskeletal:     Right lower leg: Edema present.     Left lower leg: Edema (mild residual erythema  to the anterior shin) present.  Skin:    General: Skin is warm and dry.  Neurological:     Mental Status: She is alert and oriented to person, place, and time.  Psychiatric:        Mood and Affect: Mood normal.        Behavior: Behavior normal.        Thought Content: Thought content normal.        Judgment: Judgment normal.    Assessment & Plan   Bilateral lower extremity edema Prior bilateral lower extremity rash mostly resolved however continued left lower extremity residual redness and swelling. Completed Keflex  course with good tolerance. - Discussed possibly extending antibiotics however symptoms remaining are mild and not bothersome; she would prefer to hold off. - Monitor left leg for improvement or worsening of symptoms. - Contact provider if symptoms do not improve or worsen for potential further treatment.  Prediabetes with glucosuria Glucosuria present without infection. A1c at 5.9 today indicates continued prediabetes. Glucose in urine may relate to prediabetes or other factors. - Advised limiting carbohydrates and sweets.  Chronic kidney disease stage 3a Recent creatinine at 1.26, BUN at 25, possibly related to increased torsemide . Estimated GFR at 42, consistent with stage 3 CKD. - Continue to monitor kidney function and hydration status. - Follow up with nephrologist as directed.  General Health Maintenance Wellness exam and DEXA scan scheduled. Surgery date changed to January 22nd. Blood pressure and heart rate are well-controlled. - Complete wellness exam on January  20th. - Proceed with surgery on January 22nd. - Schedule DEXA scan at convenience.     Follow up   Return if symptoms worsen or fail to improve. __________________________________ Zada FREDRIK Palin, DNP, APRN, FNP-BC Primary Care and Sports Medicine [facility] [place] "

## 2024-11-22 ENCOUNTER — Ambulatory Visit: Payer: Self-pay | Admitting: Medical-Surgical

## 2024-11-22 DIAGNOSIS — R8290 Unspecified abnormal findings in urine: Secondary | ICD-10-CM

## 2024-11-22 LAB — URINE CULTURE

## 2024-11-26 ENCOUNTER — Ambulatory Visit (INDEPENDENT_AMBULATORY_CARE_PROVIDER_SITE_OTHER): Admitting: Medical-Surgical

## 2024-11-26 ENCOUNTER — Encounter: Payer: Self-pay | Admitting: Medical-Surgical

## 2024-11-26 VITALS — BP 110/69 | HR 95 | Temp 97.8°F | Resp 20 | Ht 66.0 in | Wt 261.0 lb

## 2024-11-26 DIAGNOSIS — H269 Unspecified cataract: Secondary | ICD-10-CM

## 2024-11-26 DIAGNOSIS — Z0000 Encounter for general adult medical examination without abnormal findings: Secondary | ICD-10-CM

## 2024-11-26 NOTE — Progress Notes (Signed)
 "  Chief Complaint  Patient presents with   Annual Exam    AWV     Subjective:   [name] is a 84 y.o. female who presents for a Medicare Annual Wellness Visit.  Visit info / Clinical Intake: Medicare Wellness Visit Type:: Subsequent Annual Wellness Visit Persons participating in visit and providing information:: patient Medicare Wellness Visit Mode:: In-person (required for WTM) Interpreter Needed?: No Pre-visit prep was completed: no AWV questionnaire completed by patient prior to visit?: no Living arrangements:: (!) lives alone Patient's Overall Health Status Rating: (!) fair Typical amount of pain: some Does pain affect daily life?: no Are you currently prescribed opioids?: no  Dietary Habits and Nutritional Risks How many meals a day?: 2 Eats fruit and vegetables daily?: yes Most meals are obtained by: preparing own meals Diabetic:: no  Functional Status Activities of Daily Living (to include ambulation/medication): Independent Ambulation: Independent Medication Administration: Independent Home Management (perform basic housework or laundry): Independent Manage your own finances?: yes Primary transportation is: driving Concerns about vision?: no *vision screening is required for WTM* Concerns about hearing?: no  Fall Screening Falls in the past year?: 0 Number of falls in past year: 0 Was there an injury with Fall?: 0 Fall Risk Category Calculator: 0 Patient Fall Risk Level: Low Fall Risk  Fall Risk Patient at Risk for Falls Due to: No Fall Risks Fall risk Follow up: Falls evaluation completed  Home and Transportation Safety: All rugs have non-skid backing?: N/A, no rugs All stairs or steps have railings?: yes Grab bars in the bathtub or shower?: yes Have non-skid surface in bathtub or shower?: yes Good home lighting?: yes Regular seat belt use?: yes Hospital stays in the last year:: no  Cognitive Assessment Difficulty concentrating,  remembering, or making decisions? : no Will 6CIT or Mini Cog be Completed: yes What year is it?: 0 points What month is it?: 0 points Give patient an address phrase to remember (5 components): [address] [place] [address] About what time is it?: 0 points Count backwards from 20 to 1: 0 points Say the months of the year in reverse: 0 points Repeat the address phrase from earlier: 0 points 6 CIT Score: 0 points  Advance Directives (For Healthcare) Does Patient Have a Medical Advance Directive?: No Would patient like information on creating a medical advance directive?: Yes (MAU/Ambulatory/Procedural Areas - Information given)  Reviewed/Updated  Reviewed/Updated: Reviewed All (Medical, Surgical, Family, Medications, Allergies, Care Teams, Patient Goals)    Allergies (verified) Corticosteroids, Gluten meal, and Methylprednisolone  sodium succ   Current Medications (verified) Outpatient Encounter Medications as of 11/26/2024  Medication Sig   AMBULATORY NON FORMULARY MEDICATION Medication Name: Depends adult briefs, size XXL, 100 per month; Dx code: N73.42 Urinary incontinence without sensory awareness   apixaban  (ELIQUIS ) 5 MG TABS tablet Take 1 tablet (5 mg total) by mouth 2 (two) times daily.   Ascorbic Acid (VITAMIN C PO) Take by mouth daily.   Cholecalciferol (VITAMIN D3) 50 MCG (2000 UT) CAPS Take 2,000 Units by mouth every morning.   dapagliflozin  propanediol (FARXIGA ) 10 MG TABS tablet Take 1 tablet (10 mg total) by mouth daily before breakfast.   diphenhydrAMINE  HCl (BENADRYL  PO) Take by mouth as needed.   Magnesium 300 MG CAPS Take 600 mg by mouth at bedtime.   metoprolol  tartrate (LOPRESSOR ) 50 MG tablet Take 1.5 tablets (75 mg total) by mouth 2 (two) times daily.   Misc Natural Products (WATER PILL PO) Take by mouth.  Lymph system support   Nutritional Supplements (BRAIN SUPPORT PO) Take by mouth. Cerebra   POTASSIUM CITRATE PO Take by mouth at bedtime.   vitamin B-12  (CYANOCOBALAMIN) 500 MCG tablet Take 500 mcg by mouth daily.   [DISCONTINUED] medroxyPROGESTERone (PROVERA) 5 MG tablet Take 5 mg by mouth in the morning and at bedtime.   No facility-administered encounter medications on file as of 11/26/2024.    History: Past Medical History:  Diagnosis Date   A-fib (HCC)    Aortic stenosis    Congestive heart failure (CHF) (HCC)    Hypertension    Past Surgical History:  Procedure Laterality Date   BILATERAL CARPAL TUNNEL RELEASE     DILATION AND CURETTAGE OF UTERUS     JOINT REPLACEMENT     TOTAL KNEE ARTHROPLASTY Bilateral    Family History  Problem Relation Age of Onset   Angina Mother    Alcoholism Mother    Cirrhosis Mother    Heart disease Father    COPD Father    Cancer Daughter 34       Breast cancer   Social History   Occupational History   Occupation: Retired, prior runner, broadcasting/film/video  Tobacco Use   Smoking status: Never    Passive exposure: Never   Smokeless tobacco: Never  Vaping Use   Vaping status: Never Used  Substance and Sexual Activity   Alcohol use: Yes    Comment: rare   Drug use: Never   Sexual activity: Not Currently   Tobacco Counseling Counseling given: Not Answered  SDOH Screenings   Food Insecurity: Low Risk (11/12/2024)   Received from [facility]  Housing: Unknown (11/26/2024)  Transportation Needs: No Transportation Needs (11/26/2024)  Utilities: Not At Risk (11/26/2024)  Depression (PHQ2-9): Low Risk (11/19/2024)  Social Connections: Socially Isolated (11/26/2024)  Stress: Stress Concern Present (11/26/2024)  Tobacco Use: Low Risk (11/26/2024)  Health Literacy: Adequate Health Literacy (11/26/2024)   See flowsheets for full screening details  Depression Screen PHQ 2 & 9 Depression Scale- Over the past 2 weeks, how often have you been bothered by any of the following problems? Little interest or pleasure in doing things: 1 Feeling down, depressed, or hopeless (PHQ Adolescent also includes...irritable):  1 PHQ-2 Total Score: 2 Trouble falling or staying asleep, or sleeping too much: 0 Feeling tired or having little energy: 1 Poor appetite or overeating (PHQ Adolescent also includes...weight loss): 0 Feeling bad about yourself - or that you are a failure or have let yourself or your family down: 0 Trouble concentrating on things, such as reading the newspaper or watching television (PHQ Adolescent also includes...like school work): 0 Moving or speaking so slowly that other people could have noticed. Or the opposite - being so fidgety or restless that you have been moving around a lot more than usual: 0 Thoughts that you would be better off dead, or of hurting yourself in some way: 0 PHQ-9 Total Score: 3 If you checked off any problems, how difficult have these problems made it for you to do your work, take care of things at home, or get along with other people?: Not difficult at all  Depression Treatment Depression Interventions/Treatment : EYV7-0 Score <4 Follow-up Not Indicated     Goals Addressed             This Visit's Progress    Patient Stated       Get through her upcoming surgery and recovery so she can resume regular exercise.  Objective:    Today's Vitals   11/26/24 0910  BP: 110/69  Pulse: 95  Resp: 20  Temp: 97.8 F (36.6 C)  TempSrc: Oral  SpO2: 100%  Weight: 261 lb (118.4 kg)  Height: 5' 6 (1.676 m)   Body mass index is 42.13 kg/m.  Hearing/Vision screen No results found. Immunizations and Health Maintenance Health Maintenance  Topic Date Due   Bone Density Scan  Never done   DTaP/Tdap/Td (2 - Td or Tdap) 11/13/2023   COVID-19 Vaccine (5 - 2025-26 season) 12/05/2024 (Originally 07/08/2024)   Medicare Annual Wellness (AWV)  11/26/2025   Pneumococcal Vaccine: 50+ Years  Completed   Influenza Vaccine  Completed   Zoster Vaccines- Shingrix  Completed   Meningococcal B Vaccine  Aged Out      Assessment/Plan:  This is a routine wellness  examination for Dyann.  Patient Care Team: Willo Mini, NP as PCP - General (Nurse Practitioner) Pietro Redell RAMAN, MD as PCP - Cardiology (Cardiology)  I have personally reviewed and noted the following in the patients chart:   Medical and social history Use of alcohol, tobacco or illicit drugs  Current medications and supplements including opioid prescriptions. Functional ability and status Nutritional status Physical activity Advanced directives List of other physicians Hospitalizations, surgeries, and ER visits in previous 12 months Vitals Screenings to include cognitive, depression, and falls Referrals and appointments  Orders Placed This Encounter  Procedures   Ambulatory referral to Ophthalmology    Referral Priority:   Routine    Referral Type:   Consultation    Referral Reason:   Specialty Services Required    Requested Specialty:   Ophthalmology    Number of Visits Requested:   1   In addition, I have reviewed and discussed with patient certain preventive protocols, quality metrics, and best practice recommendations. A written personalized care plan for preventive services as well as general preventive health recommendations were provided to patient.  Mini Willo, NP   11/26/2024   Return in about 1 year (around 11/26/2025) for AWV .  After Visit Summary: (In Person-Declined) Patient declined AVS at this time. "

## 2024-11-26 NOTE — Patient Instructions (Signed)
 [name],  Thank you for taking the time for your Medicare Wellness Visit. I appreciate your continued commitment to your health goals. Please review the care plan we discussed, and feel free to reach out if I can assist you further.  Please note that Annual Wellness Visits do not include a physical exam. Some assessments may be limited, especially if the visit was conducted virtually. If needed, we may recommend an in-person follow-up with your provider.  Ongoing Care Seeing your primary care provider every 3 to 6 months helps us  monitor your health and provide consistent, personalized care.   Referrals If a referral was made during today's visit and you haven't received any updates within two weeks, please contact the referred provider directly to check on the status.  Recommended Screenings:  Health Maintenance  Topic Date Due   Osteoporosis screening with Bone Density Scan  Never done   DTaP/Tdap/Td vaccine (2 - Td or Tdap) 11/13/2023   COVID-19 Vaccine (5 - 2025-26 season) 12/05/2024*   Medicare Annual Wellness Visit  11/26/2025   Pneumococcal Vaccine for age over 47  Completed   Flu Shot  Completed   Zoster (Shingles) Vaccine  Completed   Meningitis B Vaccine  Aged Out  *Topic was postponed. The date shown is not the original due date.       11/26/2024    9:38 AM  Advanced Directives  Does Patient Have a Medical Advance Directive? No  Would patient like information on creating a medical advance directive? Yes (MAU/Ambulatory/Procedural Areas - Information given)    Vision: Annual vision screenings are recommended for early detection of glaucoma, cataracts, and diabetic retinopathy. These exams can also reveal signs of chronic conditions such as diabetes and high blood pressure.  Dental: Annual dental screenings help detect early signs of oral cancer, gum disease, and other conditions linked to overall health, including heart disease and diabetes.  Please see the attached  documents for additional preventive care recommendations.  Advance Directive  Advance directives are legal papers that state your wishes about health care decisions. They let your wishes be known to family, friends, and health care providers if you become unable to speak for yourself.  You should write these papers out over time rather than all at once. They can be changed and updated at any time. The types of advance directives include: Medical power of attorney (POA). Living wills. Do not resuscitate (DNR) or do not attempt resuscitation (DNAR) orders. What are a health care proxy and medical POA? A health care proxy is also called a health care agent. It's a person you choose to make medical decisions for you when you can't make them for yourself. In most cases, a proxy is a trusted friend or family member. A medical POA is legal paperwork that names your proxy. It may need to be: Signed. Notarized. Dated. Copied. Witnessed. Added to your medical record. You may also want to choose someone to handle your money if you can't do so. This is called a durable POA for finances. It's separate from a medical POA. You may choose your health care proxy or someone else to act as your agent in money matters. If you don't have a proxy, or if the proxy may not be acting in your best interest, a court may choose a guardian to act on your behalf. What is a living will? A living will is legal paperwork that states your wishes about medical care. Providers should keep a copy of it in  your medical record. You may want to give a copy to family members or friends. You can also keep a card in your wallet to let loved ones know you have a living will and where they can find it. A living will may be used if: You're very sick with something that will end your life. You become disabled. You can't make decisions or speak for yourself. Your living will should include whether: To use or not use life support  equipment. This may include machines to filter your blood or to help you breathe. You want a DNR or DNAR order. This tells providers not to use CPR if your heart or breathing stops. To use or not use tube feeding. You want to be given foods and fluids. You want a type of comfort care called palliative care. This may be given when the goal for treatment becomes comfort rather than a cure. You want to donate your organs and tissues. A living will doesn't say what to do with your money and property if you pass away. What is a DNR or DNAR? A DNR or DNAR order is a request not to have CPR. If you don't have one of these orders, a provider will try to help you if your heart stops or you stop breathing.  If you plan to have surgery, talk with your provider about your DNR or DNAR order. What happens if I don't have an advance directive? Each state has its own laws about advance directives. Some states assign family decision makers to act on your behalf if you don't have an advance directive.  Check with your provider, attorney, or state representative about the laws in your state. Where to find more information Each state has its own laws about advance directives. You can look up these laws at: https://rodriguez-phillips.com/ This information is not intended to replace advice given to you by your health care provider. Make sure you discuss any questions you have with your health care provider. Document Revised: 03/13/2023 Document Reviewed: 03/13/2023 Elsevier Patient Education  2024 [employer].

## 2024-12-12 ENCOUNTER — Ambulatory Visit

## 2024-12-12 ENCOUNTER — Ambulatory Visit
Admission: EM | Admit: 2024-12-12 | Discharge: 2024-12-12 | Disposition: A | Discharge status: Home / Self Care | Source: Home / Self Care | Attending: Family Medicine | Admitting: Family Medicine

## 2024-12-12 DIAGNOSIS — L03115 Cellulitis of right lower limb: Secondary | ICD-10-CM

## 2024-12-12 DIAGNOSIS — M79671 Pain in right foot: Secondary | ICD-10-CM

## 2024-12-12 DIAGNOSIS — L859 Epidermal thickening, unspecified: Secondary | ICD-10-CM

## 2024-12-12 MED ORDER — MUPIROCIN 2 % EX OINT: 1.0000 | TOPICAL_OINTMENT | Freq: Three times a day (TID) | CUTANEOUS | 0 refills | Status: AC

## 2024-12-12 MED ORDER — AMMONIUM LACTATE 12 % EX LOTN: TOPICAL_LOTION | CUTANEOUS | 0 refills | Status: AC

## 2024-12-12 NOTE — ED Provider Notes (Signed)
 " [name] CARE    CSN: [id number] Arrival date & time: 12/12/24  1305      History   Chief Complaint Chief Complaint  Patient presents with   Foot Pain    Rt    HPI TAKHIA SPOON is a 84 y.o. female.   Patient reports that she stepped on something in her house, resulting in pain in the heel of her right foot.  She does not remember a distinct incident, but notes that she normally walks barefoot in her house.  The history is provided by the patient.  Foot Pain This is a new problem. The current episode started more than 2 days ago. The problem occurs constantly. The problem has not changed since onset.The symptoms are aggravated by walking and standing. The symptoms are relieved by position. She has tried acetaminophen for the symptoms. The treatment provided mild relief.    Past Medical History:  Diagnosis Date   A-fib [facility])    Aortic stenosis    Congestive heart failure (CHF) (HCC)    Hypertension     Patient Active Problem List   Diagnosis Date Noted   Endometrial carcinoma (HCC) 09/26/2024   Paresthesia of both hands 06/27/2024   Chronic diastolic heart failure (HCC) 04/16/2024   Fatty liver 04/16/2024   Hardening of the aorta (main artery of the heart) 04/16/2024   Prediabetes 04/16/2024   Stage 3a chronic kidney disease (HCC) 04/16/2024   Thoracic aortic ectasia 04/16/2024   Urinary incontinence 04/16/2024   Vitamin D  deficiency 04/16/2024   Cardiac arrhythmia 04/16/2024   History of colon polyps 04/08/2024   Essential hypertension 02/01/2024   Cervical spine arthritis 12/02/2022   Trigger index finger of right hand 12/02/2022   Sebaceous cyst of labia 06/08/2020   History of total knee arthroplasty 04/20/2018   Osteoarthritis 03/09/2018   Endometrial polyp 07/19/2016   Vaginal itching 07/19/2016   Morbid obesity (HCC) 06/22/2016   Postmenopausal bleeding 05/25/2016    Past Surgical History:  Procedure Laterality Date   ABDOMINAL  HYSTERECTOMY     BILATERAL CARPAL TUNNEL RELEASE     DILATION AND CURETTAGE OF UTERUS     JOINT REPLACEMENT     TOTAL KNEE ARTHROPLASTY Bilateral     OB History   No obstetric history on file.      Home Medications    Prior to Admission medications  Medication Sig Start Date End Date Taking? Authorizing Provider  ammonium lactate  (LAC-HYDRIN ) 12 % lotion Apply to affected area BID 12/12/24  Yes Nneka Blanda, Garnette LABOR, MD  mupirocin  ointment (BACTROBAN ) 2 % Apply 1 Application topically 3 (three) times daily. Use for 7 to 10 days 12/12/24  Yes Hope Brandenburger, Garnette LABOR, MD  AMBULATORY NON FORMULARY MEDICATION Medication Name: Depends adult briefs, size XXL, 100 per month; Dx code: W60.57 Urinary incontinence without sensory awareness 08/26/24   Willo Mini, NP  apixaban  (ELIQUIS ) 5 MG TABS tablet Take 1 tablet (5 mg total) by mouth 2 (two) times daily. 07/02/24   Lucien Orren SAILOR, PA-C  Ascorbic Acid (VITAMIN C PO) Take by mouth daily.    [provider]  Cholecalciferol (VITAMIN D3) 50 MCG (2000 UT) CAPS Take 2,000 Units by mouth every morning.    [provider]  dapagliflozin  propanediol (FARXIGA ) 10 MG TABS tablet Take 1 tablet (10 mg total) by mouth daily before breakfast. 07/02/24   Conte, Tessa N, PA-C  diphenhydrAMINE  HCl (BENADRYL  PO) Take by mouth as needed.    [provider]  Magnesium 300 MG CAPS Take 600 mg by mouth at bedtime.    [provider]  metoprolol  tartrate (LOPRESSOR ) 50 MG tablet Take 1.5 tablets (75 mg total) by mouth 2 (two) times daily. 07/02/24   Lucien Orren SAILOR, PA-C  Misc Natural Products (WATER PILL PO) Take by mouth. Lymph system support    [provider]  Nutritional Supplements (BRAIN SUPPORT PO) Take by mouth. Cerebra    [provider]  POTASSIUM CITRATE PO Take by mouth at bedtime.    [provider]  vitamin B-12 (CYANOCOBALAMIN) 500 MCG tablet Take 500 mcg by mouth daily.    [provider]     Family History Family History  Problem Relation Age of Onset   Angina Mother    Alcoholism Mother    Cirrhosis Mother    Heart disease Father    COPD Father    Cancer Daughter 28       Breast cancer    Social History Social History[1]   Allergies   Corticosteroids, Gluten meal, and Methylprednisolone  sodium succ   Review of Systems Review of Systems  Constitutional:  Negative for activity change, appetite change, chills, diaphoresis, fatigue and fever.  Musculoskeletal:  Negative for joint swelling.       Right heel pain.  Skin:  Negative for color change and wound.  All other systems reviewed and are negative.    Physical Exam Triage Vital Signs ED Triage Vitals [12/12/24 1321]  Encounter Vitals Group     BP 125/83     Girls Systolic BP Percentile      Girls Diastolic BP Percentile      Boys Systolic BP Percentile      Boys Diastolic BP Percentile      Pulse Rate (!) 110     Resp 16     Temp 97.6 F (36.4 C)     Temp Source Oral     SpO2 96 %     Weight      Height      Head Circumference      Peak Flow      Pain Score      Pain Loc      Pain Education      Exclude from Growth Chart    No data found.  Updated Vital Signs BP 125/83 (BP Location: Right Arm)   Pulse (!) 110   Temp 97.6 F (36.4 C) (Oral)   Resp 16   SpO2 96%   Visual Acuity Right Eye Distance:   Left Eye Distance:   Bilateral Distance:    Right Eye Near:   Left Eye Near:    Bilateral Near:     Physical Exam Vitals and nursing note reviewed.  Constitutional:      General: She is not in acute distress.    Appearance: She is obese. She is not ill-appearing.  HENT:     Head: Normocephalic.  Eyes:     Pupils: Pupils are equal, round, and reactive to light.  Cardiovascular:     Rate and Rhythm: Tachycardia present.  Pulmonary:     Effort: Pulmonary effort is normal.  Musculoskeletal:     Right lower leg: Edema present.     Left lower leg: Edema present.        Feet:     Comments: Right heel has a 3cm diameter area of hyperkeratosis that is  tender to palpation.  There is no induration, swelling, or fluctuance present  Feet:  Comments: Area of hyperkeratosis, approximately 3cm diameter, tender to palpation.  No swelling, erythema, induration, or fluctuance. Skin:    General: Skin is warm and dry.  Neurological:     Mental Status: She is alert and oriented to person, place, and time.      UC Treatments / Results  Labs (all labs ordered are listed, but only abnormal results are displayed) Labs Reviewed - No data to display  EKG   Radiology DG Os Calcis Right Result Date: 12/12/2024 CLINICAL DATA:  Acute right heel pain without reported injury. EXAM: RIGHT OS CALCIS - 2+ VIEW COMPARISON:  None Available. FINDINGS: There is no evidence of fracture or other focal bone lesions. Mild posterior calcaneal spurring is noted. Metallic needle is seen in plantar soft tissues consistent with foreign body. IMPRESSION: Mild posterior calcaneal spurring. Metallic needle is seen in plantar soft tissues consistent with foreign body. Electronically Signed   By: Lynwood Landy Raddle M.D.   On: 12/12/2024 14:32    Procedures Procedures (including critical care time)  Medications Ordered in UC Medications - No data to display  Initial Impression / Assessment and Plan / UC Course  I have reviewed the triage vital signs and the nursing notes.  Pertinent labs & imaging results that were available during my care of the patient were reviewed by me and considered in my medical decision making (see chart for details).    Note incidental finding of foreign body (sewing needle) embedded in the mid-plantar soft tissue.  There is no tenderness to palpation at this location.  Patient admits that she sews by hand frequently. Begin Bactroban  ointment TID to area of hyperkeratosis right heel. Rx for Lac Hydrin lotion BID to right heel. Followup with Family Doctor if not  improved in one week.   Final Clinical Impressions(s) / UC Diagnoses   Final diagnoses:  Pain of right heel  Hyperkeratosis  Cellulitis of right heel     Discharge Instructions      Begin applying Lac Hydrin lotion when pain has decreased or resolved.  Apply moist heat to heel 2 or 3 times daily.  If symptoms become significantly worse during the night or over the weekend, proceed to the local emergency room.      ED Prescriptions     Medication Sig Dispense Auth. Provider   ammonium lactate  (LAC-HYDRIN ) 12 % lotion Apply to affected area BID 225 g Pauline Garnette LABOR, MD   mupirocin  ointment (BACTROBAN ) 2 % Apply 1 Application topically 3 (three) times daily. Use for 7 to 10 days 30 g Pauline Garnette LABOR, MD           [1]  Social History Tobacco Use   Smoking status: Never    Passive exposure: Never   Smokeless tobacco: Never  Vaping Use   Vaping status: Never Used  Substance Use Topics   Alcohol use: Yes    Comment: rare   Drug use: Never     Pauline Garnette LABOR, MD 12/13/24 2227  "

## 2024-12-12 NOTE — Discharge Instructions (Signed)
 Begin applying Lac Hydrin lotion when pain has decreased or resolved.  Apply moist heat to heel 2 or 3 times daily.  If symptoms become significantly worse during the night or over the weekend, proceed to the local emergency room.

## 2024-12-12 NOTE — ED Triage Notes (Signed)
 Pt reports stepping on an object at home and started having pain in the heel of the right foot. Pt not sure of what she stepped on. Pt has taken Tylenol at yesterday night.
